# Patient Record
Sex: Female | Born: 1940 | Race: White | Hispanic: No | Smoking: Never smoker
Health system: Southern US, Community
[De-identification: ages and names within clinical notes are randomized; demographics above are authoritative.]

## PROBLEM LIST (undated history)

## (undated) DIAGNOSIS — E079 Disorder of thyroid, unspecified: Secondary | ICD-10-CM

## (undated) DIAGNOSIS — E785 Hyperlipidemia, unspecified: Secondary | ICD-10-CM

## (undated) DIAGNOSIS — I10 Essential (primary) hypertension: Secondary | ICD-10-CM

## (undated) DIAGNOSIS — F329 Major depressive disorder, single episode, unspecified: Secondary | ICD-10-CM

## (undated) DIAGNOSIS — F32A Depression, unspecified: Secondary | ICD-10-CM

## (undated) DIAGNOSIS — E119 Type 2 diabetes mellitus without complications: Secondary | ICD-10-CM

## (undated) DIAGNOSIS — F0280 Dementia in other diseases classified elsewhere without behavioral disturbance: Secondary | ICD-10-CM

## (undated) DIAGNOSIS — G309 Alzheimer's disease, unspecified: Secondary | ICD-10-CM

---

## 2004-05-23 ENCOUNTER — Ambulatory Visit: Payer: Self-pay | Admitting: Internal Medicine

## 2005-08-24 ENCOUNTER — Ambulatory Visit: Payer: Self-pay | Admitting: Internal Medicine

## 2006-12-05 ENCOUNTER — Ambulatory Visit: Payer: Self-pay | Admitting: Internal Medicine

## 2007-12-09 ENCOUNTER — Ambulatory Visit: Payer: Self-pay | Admitting: Internal Medicine

## 2008-12-09 ENCOUNTER — Ambulatory Visit: Payer: Self-pay | Admitting: Internal Medicine

## 2008-12-29 ENCOUNTER — Ambulatory Visit: Payer: Self-pay | Admitting: Internal Medicine

## 2009-11-26 ENCOUNTER — Ambulatory Visit: Payer: Self-pay | Admitting: Family Medicine

## 2011-04-26 ENCOUNTER — Ambulatory Visit: Payer: Self-pay | Admitting: Internal Medicine

## 2012-05-26 ENCOUNTER — Ambulatory Visit: Payer: Self-pay | Admitting: Internal Medicine

## 2013-11-04 ENCOUNTER — Ambulatory Visit: Payer: Self-pay | Admitting: Ophthalmology

## 2013-12-02 ENCOUNTER — Ambulatory Visit: Payer: Self-pay | Admitting: Ophthalmology

## 2016-04-03 ENCOUNTER — Emergency Department: Payer: Medicare Other

## 2016-04-03 ENCOUNTER — Observation Stay
Admission: EM | Admit: 2016-04-03 | Discharge: 2016-04-05 | Disposition: A | Discharge status: Skilled Nursing Facility | Payer: Medicare Other | Attending: Internal Medicine | Admitting: Internal Medicine

## 2016-04-03 DIAGNOSIS — D509 Iron deficiency anemia, unspecified: Secondary | ICD-10-CM | POA: Diagnosis not present

## 2016-04-03 DIAGNOSIS — F329 Major depressive disorder, single episode, unspecified: Secondary | ICD-10-CM | POA: Diagnosis not present

## 2016-04-03 DIAGNOSIS — E039 Hypothyroidism, unspecified: Secondary | ICD-10-CM | POA: Insufficient documentation

## 2016-04-03 DIAGNOSIS — E871 Hypo-osmolality and hyponatremia: Secondary | ICD-10-CM

## 2016-04-03 DIAGNOSIS — I700 Atherosclerosis of aorta: Secondary | ICD-10-CM | POA: Diagnosis not present

## 2016-04-03 DIAGNOSIS — R319 Hematuria, unspecified: Secondary | ICD-10-CM | POA: Diagnosis not present

## 2016-04-03 DIAGNOSIS — G3189 Other specified degenerative diseases of nervous system: Secondary | ICD-10-CM | POA: Diagnosis not present

## 2016-04-03 DIAGNOSIS — E785 Hyperlipidemia, unspecified: Secondary | ICD-10-CM | POA: Insufficient documentation

## 2016-04-03 DIAGNOSIS — Y92002 Bathroom of unspecified non-institutional (private) residence single-family (private) house as the place of occurrence of the external cause: Secondary | ICD-10-CM | POA: Diagnosis not present

## 2016-04-03 DIAGNOSIS — I10 Essential (primary) hypertension: Secondary | ICD-10-CM | POA: Insufficient documentation

## 2016-04-03 DIAGNOSIS — Z794 Long term (current) use of insulin: Secondary | ICD-10-CM | POA: Insufficient documentation

## 2016-04-03 DIAGNOSIS — W010XXA Fall on same level from slipping, tripping and stumbling without subsequent striking against object, initial encounter: Secondary | ICD-10-CM | POA: Insufficient documentation

## 2016-04-03 DIAGNOSIS — E119 Type 2 diabetes mellitus without complications: Secondary | ICD-10-CM | POA: Diagnosis not present

## 2016-04-03 DIAGNOSIS — N3000 Acute cystitis without hematuria: Principal | ICD-10-CM

## 2016-04-03 DIAGNOSIS — G309 Alzheimer's disease, unspecified: Secondary | ICD-10-CM | POA: Insufficient documentation

## 2016-04-03 DIAGNOSIS — F0280 Dementia in other diseases classified elsewhere without behavioral disturbance: Secondary | ICD-10-CM | POA: Diagnosis present

## 2016-04-03 DIAGNOSIS — Z7984 Long term (current) use of oral hypoglycemic drugs: Secondary | ICD-10-CM | POA: Insufficient documentation

## 2016-04-03 DIAGNOSIS — Y998 Other external cause status: Secondary | ICD-10-CM | POA: Diagnosis not present

## 2016-04-03 DIAGNOSIS — I350 Nonrheumatic aortic (valve) stenosis: Secondary | ICD-10-CM | POA: Diagnosis not present

## 2016-04-03 DIAGNOSIS — R531 Weakness: Secondary | ICD-10-CM

## 2016-04-03 DIAGNOSIS — R0989 Other specified symptoms and signs involving the circulatory and respiratory systems: Secondary | ICD-10-CM

## 2016-04-03 DIAGNOSIS — Z66 Do not resuscitate: Secondary | ICD-10-CM | POA: Diagnosis not present

## 2016-04-03 DIAGNOSIS — M50322 Other cervical disc degeneration at C5-C6 level: Secondary | ICD-10-CM | POA: Insufficient documentation

## 2016-04-03 DIAGNOSIS — N390 Urinary tract infection, site not specified: Secondary | ICD-10-CM

## 2016-04-03 DIAGNOSIS — R778 Other specified abnormalities of plasma proteins: Secondary | ICD-10-CM

## 2016-04-03 DIAGNOSIS — R748 Abnormal levels of other serum enzymes: Secondary | ICD-10-CM | POA: Insufficient documentation

## 2016-04-03 DIAGNOSIS — W19XXXA Unspecified fall, initial encounter: Secondary | ICD-10-CM

## 2016-04-03 DIAGNOSIS — M6282 Rhabdomyolysis: Secondary | ICD-10-CM | POA: Insufficient documentation

## 2016-04-03 DIAGNOSIS — D649 Anemia, unspecified: Secondary | ICD-10-CM | POA: Diagnosis not present

## 2016-04-03 DIAGNOSIS — R7989 Other specified abnormal findings of blood chemistry: Secondary | ICD-10-CM | POA: Diagnosis present

## 2016-04-03 DIAGNOSIS — M25552 Pain in left hip: Secondary | ICD-10-CM | POA: Insufficient documentation

## 2016-04-03 DIAGNOSIS — I6523 Occlusion and stenosis of bilateral carotid arteries: Secondary | ICD-10-CM | POA: Insufficient documentation

## 2016-04-03 DIAGNOSIS — R262 Difficulty in walking, not elsewhere classified: Secondary | ICD-10-CM

## 2016-04-03 DIAGNOSIS — Y9389 Activity, other specified: Secondary | ICD-10-CM | POA: Diagnosis not present

## 2016-04-03 DIAGNOSIS — Z7982 Long term (current) use of aspirin: Secondary | ICD-10-CM | POA: Insufficient documentation

## 2016-04-03 HISTORY — DX: Essential (primary) hypertension: I10

## 2016-04-03 HISTORY — DX: Alzheimer's disease, unspecified: G30.9

## 2016-04-03 HISTORY — DX: Depression, unspecified: F32.A

## 2016-04-03 HISTORY — DX: Dementia in other diseases classified elsewhere, unspecified severity, without behavioral disturbance, psychotic disturbance, mood disturbance, and anxiety: F02.80

## 2016-04-03 HISTORY — DX: Disorder of thyroid, unspecified: E07.9

## 2016-04-03 HISTORY — DX: Hyperlipidemia, unspecified: E78.5

## 2016-04-03 HISTORY — DX: Type 2 diabetes mellitus without complications: E11.9

## 2016-04-03 HISTORY — DX: Major depressive disorder, single episode, unspecified: F32.9

## 2016-04-03 LAB — CBC WITH DIFFERENTIAL/PLATELET
BASOS ABS: 0 10*3/uL (ref 0–0.1)
BASOS PCT: 0 %
EOS ABS: 0 10*3/uL (ref 0–0.7)
EOS PCT: 0 %
HCT: 31.6 % — ABNORMAL LOW (ref 35.0–47.0)
Hemoglobin: 10.5 g/dL — ABNORMAL LOW (ref 12.0–16.0)
LYMPHS PCT: 8 %
Lymphs Abs: 0.9 10*3/uL — ABNORMAL LOW (ref 1.0–3.6)
MCH: 28.6 pg (ref 26.0–34.0)
MCHC: 33.1 g/dL (ref 32.0–36.0)
MCV: 86.2 fL (ref 80.0–100.0)
MONO ABS: 1.3 10*3/uL — AB (ref 0.2–0.9)
Monocytes Relative: 10 %
Neutro Abs: 10.3 10*3/uL — ABNORMAL HIGH (ref 1.4–6.5)
Neutrophils Relative %: 82 %
PLATELETS: 320 10*3/uL (ref 150–440)
RBC: 3.66 MIL/uL — AB (ref 3.80–5.20)
RDW: 15.6 % — AB (ref 11.5–14.5)
WBC: 12.6 10*3/uL — AB (ref 3.6–11.0)

## 2016-04-03 LAB — URINALYSIS COMPLETE WITH MICROSCOPIC (ARMC ONLY)
BILIRUBIN URINE: NEGATIVE
KETONES UR: NEGATIVE mg/dL
NITRITE: POSITIVE — AB
PH: 5 (ref 5.0–8.0)
Protein, ur: 100 mg/dL — AB
SPECIFIC GRAVITY, URINE: 1.02 (ref 1.005–1.030)
SQUAMOUS EPITHELIAL / LPF: NONE SEEN

## 2016-04-03 LAB — BASIC METABOLIC PANEL
Anion gap: 10 (ref 5–15)
BUN: 12 mg/dL (ref 6–20)
CHLORIDE: 97 mmol/L — AB (ref 101–111)
CO2: 25 mmol/L (ref 22–32)
CREATININE: 0.75 mg/dL (ref 0.44–1.00)
Calcium: 9.1 mg/dL (ref 8.9–10.3)
GFR calc non Af Amer: >60 mL/min (ref >60)
GLUCOSE: 216 mg/dL — AB (ref 65–99)
Potassium: 3.8 mmol/L (ref 3.5–5.1)
Sodium: 132 mmol/L — ABNORMAL LOW (ref 135–145)

## 2016-04-03 LAB — TROPONIN I: Troponin I: 0.38 ng/mL (ref <0.03)

## 2016-04-03 MED ORDER — LORAZEPAM 2 MG/ML IJ SOLN
INTRAMUSCULAR | Status: AC
  Administered 2016-04-04: 1 mg via INTRAVENOUS
  Filled 2016-04-03: qty 1

## 2016-04-03 MED ORDER — DEXTROSE 5 % IV SOLN
1.0000 g | Freq: Once | INTRAVENOUS | Status: AC
  Administered 2016-04-03: 1 g via INTRAVENOUS

## 2016-04-03 MED ORDER — LORAZEPAM 2 MG/ML IJ SOLN
1.0000 mg | Freq: Once | INTRAMUSCULAR | Status: AC
  Administered 2016-04-04: 1 mg via INTRAVENOUS

## 2016-04-03 MED ORDER — CEFTRIAXONE SODIUM-DEXTROSE 1-3.74 GM-% IV SOLR
INTRAVENOUS | Status: AC
  Filled 2016-04-03: qty 50

## 2016-04-03 NOTE — ED Notes (Signed)
Dr Don PerkingVeronese notified of elevated troponin

## 2016-04-03 NOTE — ED Notes (Signed)
2 unsuccessful PIV attempts by this RN (Rt AC and Rt forearm). Rosey Batheresa, primary RN, made aware.

## 2016-04-03 NOTE — ED Notes (Addendum)
Pt arrived via ems for c/o unwitnessed fall - ems reports that pt was cool and pale on arrival and pt was found in floor in bathroom by staff - pt denies any pain or injury - [facility] pt does have history of alzheimer's and is a poor historian

## 2016-04-03 NOTE — ED Notes (Signed)
In and out cath to obtain urine sample - procedure tolerated well

## 2016-04-03 NOTE — ED Triage Notes (Signed)
Pt arrived via ems for c/o unwitnessed fall - ems reports that pt was cool and pale on arrival and found in floor in bathroom - pt denies any pain or injury

## 2016-04-03 NOTE — ED Provider Notes (Signed)
[facility] Emergency Department Provider Note  ____________________________________________  Time seen: Approximately 7:35 PM  I have reviewed the triage vital signs and the nursing notes.   HISTORY  Chief Complaint Fall  Level 5 caveat:  Portions of the history and physical were unable to be obtained due to dementia   HPI Glenda Vaughn is a 75 y.o. female with a history of Alzheimer's dementia, diabetes, depression, hypertension, hyperlipidemia who presents for evaluation of unwitnessed fall. Patient was found in her skilled nursing facility on the ground next to the toilet. According to EMS patient was pale and cold and they found her. Patient does not remember falling. Patient has no complaints at this time and denies headache, neck pain, back pain, chest pain, shortness of breath, abdominal pain, extremity pain. Patient is not on any blood thinners. According to patient's daughter, patient seemed a little bit more unsteady with her gait this afternoon when daughter dropped her off at the SNF.  Past Medical History:  Diagnosis Date  . Alzheimer's dementia   . Depression   . Diabetes mellitus without complication (HCC)   . Hyperlipidemia   . Hypertension   . Thyroid disease     There are no active problems to display for this patient.   History reviewed. No pertinent surgical history.  Prior to Admission medications   Medication Sig Start Date End Date Taking? Authorizing Provider  aspirin 81 MG chewable tablet Chew 81 mg by mouth daily.   Yes Historical Provider, MD  atorvastatin (LIPITOR) 20 MG tablet Take 20 mg by mouth daily at 6 PM.   Yes Historical Provider, MD  Calcium Carbonate-Vitamin D3 (CALCIUM 600-D) 600-400 MG-UNIT TABS Take 1 tablet by mouth daily.   Yes Historical Provider, MD  Diphenhydramine-APAP, sleep, (TYLENOL PM EXTRA STRENGTH PO) Take 1 tablet by mouth every evening.   Yes Historical Provider, MD  donepezil (ARICEPT) 10 MG  tablet Take 10 mg by mouth daily.   Yes Historical Provider, MD  FLUoxetine (PROZAC) 20 MG capsule Take 20 mg by mouth daily.   Yes Historical Provider, MD  glimepiride (AMARYL) 2 MG tablet Take 2 mg by mouth daily with breakfast.   Yes Historical Provider, MD  levothyroxine (SYNTHROID, LEVOTHROID) 112 MCG tablet Take 112 mcg by mouth daily before breakfast.   Yes Historical Provider, MD  loperamide (IMODIUM) 2 MG capsule Take 2 mg by mouth as needed for diarrhea or loose stools.   Yes Historical Provider, MD  memantine (NAMENDA) 10 MG tablet Take 10 mg by mouth 2 (two) times daily.   Yes Historical Provider, MD  metFORMIN (GLUCOPHAGE-XR) 500 MG 24 hr tablet Take 500 mg by mouth 2 (two) times daily.   Yes Historical Provider, MD  Multiple Vitamin (MULTIVITAMIN) tablet Take 1 tablet by mouth daily.   Yes Historical Provider, MD  naproxen sodium (ANAPROX) 220 MG tablet Take 220 mg by mouth 2 (two) times daily with a meal.   Yes Historical Provider, MD  Probiotic Product (ALIGN) 4 MG CAPS Take 4 mg by mouth daily.   Yes Historical Provider, MD  vitamin C (ASCORBIC ACID) 500 MG tablet Take 500 mg by mouth daily.   Yes Historical Provider, MD    Allergies Review of patient's allergies indicates no known allergies.  No family history on file.  Social History Social History  Substance Use Topics  . Smoking status: Never Smoker  . Smokeless tobacco: Never Used  . Alcohol use No    Review of  Systems  Constitutional: Negative for fever. Eyes: Negative for visual changes. ENT: Negative for facial injury or neck injury Cardiovascular: Negative for chest injury. Respiratory: Negative for shortness of breath. Negative for chest wall injury. Gastrointestinal: Negative for abdominal pain or injury. Genitourinary: Negative for dysuria. Musculoskeletal: Negative for back injury, negative for arm or leg pain. Skin: Negative for laceration/abrasions. Neurological: Negative for head  injury.  ____________________________________________   PHYSICAL EXAM:  VITAL SIGNS: ED Triage Vitals  Enc Vitals Group     BP 04/03/16 1909 125/67     Pulse Rate 04/03/16 1909 86     Resp 04/03/16 1909 16     Temp 04/03/16 1909 98.2 F (36.8 C)     Temp src --      SpO2 04/03/16 1909 100 %     Weight 04/03/16 1905 122 lb 12.7 oz (55.7 kg)     Height 04/03/16 1905 5\' 8"  (1.727 m)     Head Circumference --      Peak Flow --      Pain Score 04/03/16 1906 0     Pain Loc --      Pain Edu? --      Excl. in GC? --     Constitutional: Alert and oriented. No acute distress. Does not appear intoxicated. HEENT Head: Normocephalic and atraumatic. Face: No facial bony tenderness. Stable midface Ears: No hemotympanum bilaterally. No Battle sign Eyes: No eye injury. PERRL. No raccoon eyes Nose: Nontender. No epistaxis. No rhinorrhea Mouth/Throat: Mucous membranes are moist. No oropharyngeal blood. No dental injury. Airway patent without stridor. Normal voice. Neck: no C-collar in place. No midline c-spine tenderness.  Cardiovascular: Normal rate, regular rhythm. Normal and symmetric distal pulses are present in all extremities. Pulmonary/Chest: Chest wall is stable and nontender to palpation/compression. Normal respiratory effort. Breath sounds are normal. No crepitus.  Abdominal: Soft, nontender, non distended. Musculoskeletal: Nontender with normal full range of motion in all extremities. No deformities. No thoracic or lumbar midline spinal tenderness. Pelvis is stable. Mild ttp over the L hip with external rotation. Skin: Skin is warm, dry and intact. No abrasions or contutions. Psychiatric: Speech and behavior are appropriate. Neurological: Normal speech and language. Moves all extremities to command. No gross focal neurologic deficits are appreciated.  Glascow Coma Score: 4 - Opens eyes on own 6 - Follows simple motor commands 5 - Alert and oriented GCS:  15   ____________________________________________   LABS (all labs ordered are listed, but only abnormal results are displayed)  Labs Reviewed  CBC WITH DIFFERENTIAL/PLATELET - Abnormal; Notable for the following:       Result Value   WBC 12.6 (*)    RBC 3.66 (*)    Hemoglobin 10.5 (*)    HCT 31.6 (*)    RDW 15.6 (*)    Neutro Abs 10.3 (*)    Lymphs Abs 0.9 (*)    Monocytes Absolute 1.3 (*)    All other components within normal limits  URINALYSIS COMPLETEWITH MICROSCOPIC ([facility] ONLY) - Abnormal; Notable for the following:    Color, Urine YELLOW (*)    APPearance CLOUDY (*)    Glucose, UA >500 (*)    Hgb urine dipstick 3+ (*)    Protein, ur 100 (*)    Nitrite POSITIVE (*)    Leukocytes, UA 2+ (*)    Bacteria, UA MANY (*)    All other components within normal limits  BASIC METABOLIC PANEL - Abnormal; Notable for the following:    Sodium  132 (*)    Chloride 97 (*)    Glucose, Bld 216 (*)    All other components within normal limits  TROPONIN I - Abnormal; Notable for the following:    Troponin I 0.38 (*)    All other components within normal limits  URINE CULTURE   ____________________________________________  EKG  ED ECG REPORT I, Nita Sickle, the attending physician, personally viewed and interpreted this ECG.  Normal sinus rhythm, rate of 89, normal intervals, normal axis, no ST elevations or depressions. ____________________________________________  RADIOLOGY  CT head and c-spine:  Atrophy with small vessel chronic ischemic changes of deep cerebral white matter. No acute intracranial abnormalities. Scattered degenerative disc and facet disease changes of the cervical spine as above. No acute cervical spine abnormalities.  XR hip: Normal left hip. ____________________________________________   PROCEDURES  Procedure(s) performed: None Procedures Critical Care performed: yes  CRITICAL CARE Performed by: Nita Sickle  ?  Total critical  care time:  Critical care time was exclusive of separately billable procedures and treating other patients.  Critical care was necessary to treat or prevent imminent or life-threatening deterioration.  Critical care was time spent personally by me on the following activities: development of treatment plan with patient and/or surrogate as well as nursing, discussions with consultants, evaluation of patient's response to treatment, examination of patient, obtaining history from patient or surrogate, ordering and performing treatments and interventions, ordering and review of laboratory studies, ordering and review of radiographic studies, pulse oximetry and re-evaluation of patient's condition.  ____________________________________________   INITIAL IMPRESSION / ASSESSMENT AND PLAN / ED COURSE   75 y.o. female with a history of Alzheimer's dementia, diabetes, depression, hypertension, hyperlipidemia who presents for evaluation of unwitnessed fall. Patient has no complaints however does have mild tenderness with internal rotation of the left hip with no deformities. We'll get a head CT, CT cervical spine, x-ray of the left hip, EKG and basic labs and UA as fall was unwitnessed.  Clinical Course  Comment By Time  US showing UTI with mildly leukocytosis. Patient given ceftriaxone. Troponin elevated 0.38. Non ischemic EKG. Patient denies CP. Will admit to [facility], MD 10/17 2128    Pertinent labs & imaging results that were available during my care of the patient were reviewed by me and considered in my medical decision making (see chart for details).    ____________________________________________   FINAL CLINICAL IMPRESSION(S) / ED DIAGNOSES  Final diagnoses:  Fall  Elevated troponin  Urinary tract infection with hematuria, site unspecified      NEW MEDICATIONS STARTED DURING THIS VISIT:  New Prescriptions   No medications on file     Note:  This  document was prepared using Dragon voice recognition software and may include unintentional dictation errors.    Nita Sickle, MD 04/03/16 [phone]

## 2016-04-04 ENCOUNTER — Inpatient Hospital Stay (HOSPITAL_BASED_OUTPATIENT_CLINIC_OR_DEPARTMENT_OTHER)
Admit: 2016-04-04 | Discharge: 2016-04-04 | Disposition: A | Discharge status: Home / Self Care | Payer: Medicare Other | Attending: Family Medicine | Admitting: Family Medicine

## 2016-04-04 ENCOUNTER — Inpatient Hospital Stay: Payer: Medicare Other

## 2016-04-04 DIAGNOSIS — I350 Nonrheumatic aortic (valve) stenosis: Secondary | ICD-10-CM | POA: Diagnosis not present

## 2016-04-04 DIAGNOSIS — R011 Cardiac murmur, unspecified: Secondary | ICD-10-CM | POA: Diagnosis not present

## 2016-04-04 DIAGNOSIS — G309 Alzheimer's disease, unspecified: Secondary | ICD-10-CM

## 2016-04-04 DIAGNOSIS — R531 Weakness: Secondary | ICD-10-CM

## 2016-04-04 DIAGNOSIS — D509 Iron deficiency anemia, unspecified: Secondary | ICD-10-CM

## 2016-04-04 DIAGNOSIS — R748 Abnormal levels of other serum enzymes: Secondary | ICD-10-CM | POA: Diagnosis not present

## 2016-04-04 DIAGNOSIS — W19XXXA Unspecified fall, initial encounter: Secondary | ICD-10-CM | POA: Diagnosis present

## 2016-04-04 DIAGNOSIS — R7989 Other specified abnormal findings of blood chemistry: Secondary | ICD-10-CM

## 2016-04-04 DIAGNOSIS — N3000 Acute cystitis without hematuria: Secondary | ICD-10-CM

## 2016-04-04 DIAGNOSIS — N390 Urinary tract infection, site not specified: Secondary | ICD-10-CM

## 2016-04-04 DIAGNOSIS — R778 Other specified abnormalities of plasma proteins: Secondary | ICD-10-CM | POA: Diagnosis present

## 2016-04-04 DIAGNOSIS — F0280 Dementia in other diseases classified elsewhere without behavioral disturbance: Secondary | ICD-10-CM

## 2016-04-04 DIAGNOSIS — E871 Hypo-osmolality and hyponatremia: Secondary | ICD-10-CM

## 2016-04-04 DIAGNOSIS — R319 Hematuria, unspecified: Secondary | ICD-10-CM

## 2016-04-04 LAB — CK TOTAL AND CKMB (NOT AT ARMC)
CK, MB: 85.8 ng/mL — AB (ref 0.5–5.0)
RELATIVE INDEX: 0.5 (ref 0.0–2.5)
Total CK: 18411 U/L — ABNORMAL HIGH (ref 38–234)

## 2016-04-04 LAB — LIPID PANEL
Cholesterol: 154 mg/dL (ref 0–200)
HDL: 83 mg/dL (ref >40)
LDL CALC: 59 mg/dL (ref 0–99)
Total CHOL/HDL Ratio: 1.9 RATIO
Triglycerides: 61 mg/dL (ref <150)
VLDL: 12 mg/dL (ref 0–40)

## 2016-04-04 LAB — CBC
HEMATOCRIT: 32.4 % — AB (ref 35.0–47.0)
Hemoglobin: 11 g/dL — ABNORMAL LOW (ref 12.0–16.0)
MCH: 29.2 pg (ref 26.0–34.0)
MCHC: 34.1 g/dL (ref 32.0–36.0)
MCV: 85.5 fL (ref 80.0–100.0)
PLATELETS: 328 10*3/uL (ref 150–440)
RBC: 3.79 MIL/uL — ABNORMAL LOW (ref 3.80–5.20)
RDW: 15.7 % — AB (ref 11.5–14.5)
WBC: 10.5 10*3/uL (ref 3.6–11.0)

## 2016-04-04 LAB — PROTIME-INR
INR: 1.02
Prothrombin Time: 13.4 seconds (ref 11.4–15.2)

## 2016-04-04 LAB — GLUCOSE, CAPILLARY
GLUCOSE-CAPILLARY: 229 mg/dL — AB (ref 65–99)
GLUCOSE-CAPILLARY: 166 mg/dL — AB (ref 65–99)
GLUCOSE-CAPILLARY: 144 mg/dL — AB (ref 65–99)
GLUCOSE-CAPILLARY: 166 mg/dL — AB (ref 65–99)

## 2016-04-04 LAB — BASIC METABOLIC PANEL
Anion gap: 8 (ref 5–15)
BUN: 9 mg/dL (ref 6–20)
CHLORIDE: 99 mmol/L — AB (ref 101–111)
CO2: 27 mmol/L (ref 22–32)
CREATININE: 0.59 mg/dL (ref 0.44–1.00)
Calcium: 8.7 mg/dL — ABNORMAL LOW (ref 8.9–10.3)
GFR calc Af Amer: >60 mL/min (ref >60)
GFR calc non Af Amer: >60 mL/min (ref >60)
GLUCOSE: 160 mg/dL — AB (ref 65–99)
POTASSIUM: 3.5 mmol/L (ref 3.5–5.1)
SODIUM: 134 mmol/L — AB (ref 135–145)

## 2016-04-04 LAB — APTT: APTT: 29 s (ref 24–36)

## 2016-04-04 LAB — ECHOCARDIOGRAM COMPLETE
Height: 68 in
WEIGHTICAEL: 1964.74 [oz_av]

## 2016-04-04 LAB — TROPONIN I
TROPONIN I: 0.99 ng/mL — AB (ref <0.03)
TROPONIN I: 0.92 ng/mL — AB (ref <0.03)
TROPONIN I: 0.72 ng/mL — AB (ref <0.03)

## 2016-04-04 LAB — MRSA PCR SCREENING: MRSA BY PCR: NEGATIVE

## 2016-04-04 LAB — TSH: TSH: 5.239 u[IU]/mL — AB (ref 0.350–4.500)

## 2016-04-04 LAB — FOLATE: Folate: 21.6 ng/mL (ref >5.9)

## 2016-04-04 LAB — VITAMIN B12: Vitamin B-12: 216 pg/mL (ref 180–914)

## 2016-04-04 LAB — IRON AND TIBC
IRON: 11 ug/dL — AB (ref 28–170)
SATURATION RATIOS: 3 % — AB (ref 10.4–31.8)
TIBC: 355 ug/dL (ref 250–450)
UIBC: 344 ug/dL

## 2016-04-04 LAB — MAGNESIUM: MAGNESIUM: 1.8 mg/dL (ref 1.7–2.4)

## 2016-04-04 LAB — T4, FREE: Free T4: 0.96 ng/dL (ref 0.61–1.12)

## 2016-04-04 LAB — FERRITIN: FERRITIN: 24 ng/mL (ref 11–307)

## 2016-04-04 MED ORDER — LEVOTHYROXINE SODIUM 112 MCG PO TABS
112.0000 ug | ORAL_TABLET | Freq: Every day | ORAL | Status: DC
  Administered 2016-04-04 – 2016-04-05 (×2): 112 ug via ORAL
  Filled 2016-04-04 (×2): qty 1

## 2016-04-04 MED ORDER — VITAMIN C 500 MG PO TABS
500.0000 mg | ORAL_TABLET | Freq: Every day | ORAL | Status: DC
  Administered 2016-04-04 – 2016-04-05 (×2): 500 mg via ORAL
  Filled 2016-04-04 (×2): qty 1

## 2016-04-04 MED ORDER — SODIUM CHLORIDE 0.9 % IV SOLN
INTRAVENOUS | Status: DC
  Administered 2016-04-04: 75 mL/h via INTRAVENOUS

## 2016-04-04 MED ORDER — INSULIN ASPART 100 UNIT/ML ~~LOC~~ SOLN
0.0000 [IU] | Freq: Every day | SUBCUTANEOUS | Status: DC
  Administered 2016-04-04: 2 [IU] via SUBCUTANEOUS
  Filled 2016-04-04: qty 2

## 2016-04-04 MED ORDER — ENOXAPARIN SODIUM 40 MG/0.4ML ~~LOC~~ SOLN
40.0000 mg | SUBCUTANEOUS | Status: DC
  Administered 2016-04-04: 40 mg via SUBCUTANEOUS
  Filled 2016-04-04: qty 0.4

## 2016-04-04 MED ORDER — HEPARIN BOLUS VIA INFUSION
3400.0000 [IU] | Freq: Once | INTRAVENOUS | Status: AC
Start: 2016-04-04 — End: 2016-04-04
  Administered 2016-04-04: 3400 [IU] via INTRAVENOUS
  Filled 2016-04-04: qty 3400

## 2016-04-04 MED ORDER — ADULT MULTIVITAMIN W/MINERALS CH
1.0000 | ORAL_TABLET | Freq: Every day | ORAL | Status: DC
Start: 2016-04-04 — End: 2016-04-05
  Administered 2016-04-04 – 2016-04-05 (×2): 1 via ORAL
  Filled 2016-04-04 (×2): qty 1

## 2016-04-04 MED ORDER — ASPIRIN 81 MG PO CHEW
81.0000 mg | CHEWABLE_TABLET | Freq: Every day | ORAL | Status: DC
  Administered 2016-04-04 – 2016-04-05 (×2): 81 mg via ORAL
  Filled 2016-04-04 (×2): qty 1

## 2016-04-04 MED ORDER — METOPROLOL TARTRATE 25 MG PO TABS
12.5000 mg | ORAL_TABLET | Freq: Two times a day (BID) | ORAL | Status: DC
  Administered 2016-04-04 – 2016-04-05 (×3): 12.5 mg via ORAL
  Filled 2016-04-04 (×3): qty 1

## 2016-04-04 MED ORDER — INSULIN ASPART 100 UNIT/ML ~~LOC~~ SOLN
0.0000 [IU] | Freq: Three times a day (TID) | SUBCUTANEOUS | Status: DC
  Administered 2016-04-04: 2 [IU] via SUBCUTANEOUS
  Administered 2016-04-04: 1 [IU] via SUBCUTANEOUS
  Administered 2016-04-05: 2 [IU] via SUBCUTANEOUS
  Administered 2016-04-05: 5 [IU] via SUBCUTANEOUS
  Filled 2016-04-04: qty 2
  Filled 2016-04-04: qty 5
  Filled 2016-04-04: qty 2
  Filled 2016-04-04: qty 1

## 2016-04-04 MED ORDER — DEXTROSE 5 % IV SOLN: 1.0000 g | INTRAVENOUS | Status: DC

## 2016-04-04 MED ORDER — CEPHALEXIN 500 MG PO CAPS: 500.0000 mg | ORAL_CAPSULE | Freq: Three times a day (TID) | ORAL | 0 refills | Status: DC

## 2016-04-04 MED ORDER — DIPHENHYDRAMINE HCL 25 MG PO CAPS
25.0000 mg | ORAL_CAPSULE | Freq: Every day | ORAL | Status: DC
  Administered 2016-04-04 (×2): 25 mg via ORAL
  Filled 2016-04-04 (×2): qty 1

## 2016-04-04 MED ORDER — ALIGN 4 MG PO CAPS: 4.0000 mg | ORAL_CAPSULE | Freq: Every day | ORAL | Status: DC

## 2016-04-04 MED ORDER — CALCIUM CARBONATE-VITAMIN D3 600-400 MG-UNIT PO TABS: 1.0000 | ORAL_TABLET | Freq: Every day | ORAL | Status: DC

## 2016-04-04 MED ORDER — CALCIUM CARBONATE-VITAMIN D 500-200 MG-UNIT PO TABS
1.0000 | ORAL_TABLET | Freq: Every day | ORAL | Status: DC
  Administered 2016-04-04 – 2016-04-05 (×2): 1 via ORAL
  Filled 2016-04-04 (×2): qty 1

## 2016-04-04 MED ORDER — MEMANTINE HCL 5 MG PO TABS
10.0000 mg | ORAL_TABLET | Freq: Two times a day (BID) | ORAL | Status: DC
  Administered 2016-04-04 – 2016-04-05 (×3): 10 mg via ORAL
  Filled 2016-04-04 (×3): qty 2

## 2016-04-04 MED ORDER — LOPERAMIDE HCL 2 MG PO CAPS: 2.0000 mg | ORAL_CAPSULE | ORAL | Status: DC | PRN

## 2016-04-04 MED ORDER — ATORVASTATIN CALCIUM 20 MG PO TABS
20.0000 mg | ORAL_TABLET | Freq: Every day | ORAL | Status: DC
  Administered 2016-04-04: 20 mg via ORAL
  Filled 2016-04-04: qty 1

## 2016-04-04 MED ORDER — ACETAMINOPHEN 500 MG PO TABS
500.0000 mg | ORAL_TABLET | Freq: Every day | ORAL | Status: DC
  Administered 2016-04-04: 500 mg via ORAL
  Filled 2016-04-04: qty 1

## 2016-04-04 MED ORDER — ONDANSETRON HCL 4 MG/2ML IJ SOLN: 4.0000 mg | Freq: Four times a day (QID) | INTRAMUSCULAR | Status: DC | PRN

## 2016-04-04 MED ORDER — DIPHENHYDRAMINE-APAP (SLEEP) 25-500 MG PO TABS: 1.0000 | ORAL_TABLET | Freq: Every evening | ORAL | Status: DC

## 2016-04-04 MED ORDER — ACETAMINOPHEN 325 MG PO TABS: 650.0000 mg | ORAL_TABLET | ORAL | Status: DC | PRN

## 2016-04-04 MED ORDER — POTASSIUM CHLORIDE IN NACL 20-0.9 MEQ/L-% IV SOLN
INTRAVENOUS | Status: DC
  Administered 2016-04-04: 125 mL/h via INTRAVENOUS
  Administered 2016-04-04: 11:00:00 via INTRAVENOUS
  Administered 2016-04-05: 125 mL/h via INTRAVENOUS
  Filled 2016-04-04 (×5): qty 1000

## 2016-04-04 MED ORDER — CEFTRIAXONE SODIUM-DEXTROSE 1-3.74 GM-% IV SOLR
1.0000 g | INTRAVENOUS | Status: DC
  Administered 2016-04-04: 1 g via INTRAVENOUS
  Filled 2016-04-04 (×2): qty 50

## 2016-04-04 MED ORDER — DEXTROSE 5 % IV SOLN: 2.0000 g | INTRAVENOUS | Status: DC

## 2016-04-04 MED ORDER — RISAQUAD PO CAPS: 1.0000 | ORAL_CAPSULE | Freq: Every day | ORAL | Status: DC

## 2016-04-04 MED ORDER — METOPROLOL TARTRATE 5 MG/5ML IV SOLN: 5.0000 mg | Freq: Once | INTRAVENOUS | Status: DC

## 2016-04-04 MED ORDER — DONEPEZIL HCL 5 MG PO TABS
10.0000 mg | ORAL_TABLET | Freq: Every day | ORAL | Status: DC
  Administered 2016-04-04 – 2016-04-05 (×2): 10 mg via ORAL
  Filled 2016-04-04 (×2): qty 2

## 2016-04-04 MED ORDER — ASPIRIN 81 MG PO CHEW
324.0000 mg | CHEWABLE_TABLET | ORAL | Status: AC
  Administered 2016-04-04: 324 mg via ORAL
  Filled 2016-04-04: qty 4

## 2016-04-04 MED ORDER — NITROGLYCERIN 2 % TD OINT
0.5000 [in_us] | TOPICAL_OINTMENT | Freq: Three times a day (TID) | TRANSDERMAL | Status: DC
  Administered 2016-04-04 – 2016-04-05 (×3): 0.5 [in_us] via TOPICAL
  Filled 2016-04-04 (×4): qty 1

## 2016-04-04 MED ORDER — ASPIRIN 300 MG RE SUPP
300.0000 mg | RECTAL | Status: AC
  Filled 2016-04-04: qty 1

## 2016-04-04 MED ORDER — ALPRAZOLAM 0.25 MG PO TABS: 0.2500 mg | ORAL_TABLET | Freq: Two times a day (BID) | ORAL | Status: DC | PRN

## 2016-04-04 MED ORDER — FLUOXETINE HCL 20 MG PO CAPS
20.0000 mg | ORAL_CAPSULE | Freq: Every day | ORAL | Status: DC
  Administered 2016-04-04 – 2016-04-05 (×2): 20 mg via ORAL
  Filled 2016-04-04 (×2): qty 1

## 2016-04-04 MED ORDER — NITROGLYCERIN 0.4 MG SL SUBL: 0.4000 mg | SUBLINGUAL_TABLET | SUBLINGUAL | Status: DC | PRN

## 2016-04-04 MED ORDER — RISAQUAD PO CAPS
1.0000 | ORAL_CAPSULE | Freq: Two times a day (BID) | ORAL | Status: DC
  Administered 2016-04-04 – 2016-04-05 (×3): 1 via ORAL
  Filled 2016-04-04 (×3): qty 1

## 2016-04-04 MED ORDER — HEPARIN (PORCINE) IN NACL 100-0.45 UNIT/ML-% IJ SOLN
650.0000 [IU]/h | INTRAMUSCULAR | Status: DC
  Administered 2016-04-04: 650 [IU]/h via INTRAVENOUS
  Filled 2016-04-04: qty 250

## 2016-04-04 NOTE — Care Management CC44 (Signed)
Condition Code 44 Documentation Completed  Patient Details  Name: Glenda Vaughn MRN: [id number] Date of Birth: [birth year]   Condition Code 44 given:  Yes Patient signature on Condition Code 44 notice:  Yes Documentation of 2 MD's agreement:  Yes Code 44 added to claim:  Yes    Marily MemosLisa M Kelsey Edman, RN 04/04/2016, 12:13 PM

## 2016-04-04 NOTE — H&P (Addendum)
[facility] - [place] @ [facility] Admission History and Physical Glenda Vaughn, D.O.  ---------------------------------------------------------------------------------------------------------------------   PATIENT NAME: Glenda Vaughn MR#: [id number] DATE OF BIRTH: 1941/04/20 DATE OF ADMISSION: 04/03/2016 PRIMARY CARE PHYSICIAN: Jaclyn Shaggy, MD  REQUESTING/REFERRING PHYSICIAN: ED Dr. Don Perking  CHIEF COMPLAINT: Chief Complaint  Patient presents with  . Fall    HISTORY OF PRESENT ILLNESS:Please note that the history is limited by patient's severe dementia-majority of the history was obtained from the family, ED physician and chart. Glenda Vaughn is a 75 y.o. female with a known history of Alzheimer's dementia, diabetes, depression, hypertension, hyperlipidemia presents to the emergency department for evaluation of a fall. According to the patient's daughter she was found except the toilet sitting on the ground and apparently sustained a fall. She was awake and conscious when she was discovered and has had no complaints since then. Patient cannot tell me the circumstances surrounding the fall and in fact does not remember falling. Patient's daughter reportedly takes her to daycare and says that her agitation and cognition have been slightly worse today and her gait was a little bit less steady than usual.  Otherwise there has been no change in status. Patient has been taking medication as prescribed and there has been no recent change in medication or diet.  There has been no recent illness, travel or sick contacts.    Patient denies fevers/chills, weakness, dizziness, chest pain, shortness of breath, N/V/C/D, abdominal pain, dysuria/frequency, changes in mental status.   EMS/ED COURSE:   Patient received Rocephin.  PAST MEDICAL HISTORY: Past Medical History:  Diagnosis Date  . Alzheimer's dementia   . Depression   . Diabetes mellitus without complication (HCC)   .  Hyperlipidemia   . Hypertension   . Thyroid disease       PAST SURGICAL HISTORY: History reviewed. No pertinent surgical history.    SOCIAL HISTORY: Social History  Substance Use Topics  . Smoking status: Never Smoker  . Smokeless tobacco: Never Used  . Alcohol use No      FAMILY HISTORY: History reviewed. No pertinent family history.   MEDICATIONS AT HOME: Prior to Admission medications   Medication Sig Start Date End Date Taking? Authorizing Provider  aspirin 81 MG chewable tablet Chew 81 mg by mouth daily.   Yes Historical Provider, MD  atorvastatin (LIPITOR) 20 MG tablet Take 20 mg by mouth daily at 6 PM.   Yes Historical Provider, MD  Calcium Carbonate-Vitamin D3 (CALCIUM 600-D) 600-400 MG-UNIT TABS Take 1 tablet by mouth daily.   Yes Historical Provider, MD  Diphenhydramine-APAP, sleep, (TYLENOL PM EXTRA STRENGTH PO) Take 1 tablet by mouth every evening.   Yes Historical Provider, MD  donepezil (ARICEPT) 10 MG tablet Take 10 mg by mouth daily.   Yes Historical Provider, MD  FLUoxetine (PROZAC) 20 MG capsule Take 20 mg by mouth daily.   Yes Historical Provider, MD  glimepiride (AMARYL) 2 MG tablet Take 2 mg by mouth daily with breakfast.   Yes Historical Provider, MD  levothyroxine (SYNTHROID, LEVOTHROID) 112 MCG tablet Take 112 mcg by mouth daily before breakfast.   Yes Historical Provider, MD  loperamide (IMODIUM) 2 MG capsule Take 2 mg by mouth as needed for diarrhea or loose stools.   Yes Historical Provider, MD  memantine (NAMENDA) 10 MG tablet Take 10 mg by mouth 2 (two) times daily.   Yes Historical Provider, MD  metFORMIN (GLUCOPHAGE-XR) 500 MG 24 hr tablet Take 500 mg by mouth 2 (two) times daily.  Yes Historical Provider, MD  Multiple Vitamin (MULTIVITAMIN) tablet Take 1 tablet by mouth daily.   Yes Historical Provider, MD  naproxen sodium (ANAPROX) 220 MG tablet Take 220 mg by mouth 2 (two) times daily with a meal.   Yes Historical Provider, MD  Probiotic  Product (ALIGN) 4 MG CAPS Take 4 mg by mouth daily.   Yes Historical Provider, MD  vitamin C (ASCORBIC ACID) 500 MG tablet Take 500 mg by mouth daily.   Yes Historical Provider, MD      DRUG ALLERGIES: No Known Allergies   REVIEW OF SYSTEMS:Patient denies all complaints however history is unreliable secondary to dementia  PHYSICAL EXAMINATION: VITAL SIGNS: Blood pressure (!) 147/65, pulse 93, temperature 98.7 F (37.1 C), temperature source Oral, resp. rate 18, height 5\' 8"  (1.727 m), weight 55.7 kg (122 lb 12.7 oz), SpO2 100 %.  GENERAL: 75 y.o.-year-old white female patient, well-developed, well-nourished lying in the bed in no acute distress.  Pleasantly confused, hard of hearing HEENT: Head atraumatic, normocephalic. Pupils equal, round, reactive to light and accommodation. No scleral icterus. Extraocular muscles intact. Oropharynx is clear. Mucus membranes moist. NECK: Supple, full range of motion. No JVD, positive bruit bilateral carotids. No cervical lymphadenopathy. CHEST: Normal breath sounds bilaterally. No wheezing, rales, rhonchi or crackles. No use of accessory muscles of respiration.  No reproducible chest wall tenderness.  CARDIOVASCULAR: S1, S2 normal. Harsh systolic ejection murmur at left sternal border which radiates to carotids.  ABDOMEN: Soft, nontender, nondistended. No rebound, guarding, rigidity. Normoactive bowel sounds present in all four quadrants. No organomegaly or mass. EXTREMITIES: Full range of motion. No pedal edema, cyanosis, or clubbing. NEUROLOGIC: Cranial nerves II through XII are grossly intact with no focal sensorimotor deficit. Muscle strength 5/5 in all extremities. Sensation intact. Gait not checked. PSYCHIATRIC: The patient is alert and oriented x 1, person only.  SKIN: Warm, dry, and intact without obvious rash, lesion, or ulcer.  LABORATORY PANEL:  CBC  Recent Labs Lab 04/03/16 1909  WBC 12.6*  HGB 10.5*  HCT 31.6*  PLT 320    ----------------------------------------------------------------------------------------------------------------- Chemistries  Recent Labs Lab 04/03/16 2049  NA 132*  K 3.8  CL 97*  CO2 25  GLUCOSE 216*  BUN 12  CREATININE 0.75  CALCIUM 9.1   ------------------------------------------------------------------------------------------------------------------ Cardiac Enzymes  Recent Labs Lab 04/03/16 2049  TROPONINI 0.38*   ------------------------------------------------------------------------------------------------------------------  RADIOLOGY: Ct Head Wo Contrast  Result Date: 04/03/2016 CLINICAL DATA:  Unwitnessed fall, found on floor and bathroom, cool and pale when found, history Alzheimer's, diabetes mellitus, hypertension EXAM: CT HEAD WITHOUT CONTRAST CT CERVICAL SPINE WITHOUT CONTRAST TECHNIQUE: Multidetector CT imaging of the head and cervical spine was performed following the standard protocol without intravenous contrast. Multiplanar CT image reconstructions of the cervical spine were also generated. Motion artifacts on initial CT at imaging, for which repeat imaging was performed COMPARISON:  None; correlation MR brain 12/29/2008 FINDINGS: CT HEAD FINDINGS Brain: Generalized atrophy. Normal ventricular morphology. No midline shift or mass effect. Small vessel chronic ischemic changes of deep cerebral white matter. No intracranial hemorrhage, mass lesion, evidence of acute infarction, or extra-axial fluid collection. Vascular: Atherosclerotic calcifications at the carotid siphons Skull: Intact Sinuses/Orbits: Clear Other: N/A CT CERVICAL SPINE FINDINGS Alignment: Minimal retrolisthesis at C5-C6. Alignment otherwise normal Skull base and vertebrae: Skullbase intact. Vertebral body heights maintained. No fracture or bone destruction. Scattered facet degenerative changes. Soft tissues and spinal canal: Prevertebral soft tissues normal thickness. Atherosclerotic calcifications  in the carotid systems bilaterally. Disc levels: Disc  space narrowing with endplate spur formation at C5-C6 and C6-C7. Uncovertebral spurs LEFT C5-C6 and C6-C7 foramina. Upper chest: Lung apices clear Other: N/A IMPRESSION: Atrophy with small vessel chronic ischemic changes of deep cerebral white matter. No acute intracranial abnormalities. Scattered degenerative disc and facet disease changes of the cervical spine as above. No acute cervical spine abnormalities. Electronically Signed   By: Ulyses Southward M.D.   On: 04/03/2016 20:16   Ct Cervical Spine Wo Contrast  Result Date: 04/03/2016 CLINICAL DATA:  Unwitnessed fall, found on floor and bathroom, cool and pale when found, history Alzheimer's, diabetes mellitus, hypertension EXAM: CT HEAD WITHOUT CONTRAST CT CERVICAL SPINE WITHOUT CONTRAST TECHNIQUE: Multidetector CT imaging of the head and cervical spine was performed following the standard protocol without intravenous contrast. Multiplanar CT image reconstructions of the cervical spine were also generated. Motion artifacts on initial CT at imaging, for which repeat imaging was performed COMPARISON:  None; correlation MR brain 12/29/2008 FINDINGS: CT HEAD FINDINGS Brain: Generalized atrophy. Normal ventricular morphology. No midline shift or mass effect. Small vessel chronic ischemic changes of deep cerebral white matter. No intracranial hemorrhage, mass lesion, evidence of acute infarction, or extra-axial fluid collection. Vascular: Atherosclerotic calcifications at the carotid siphons Skull: Intact Sinuses/Orbits: Clear Other: N/A CT CERVICAL SPINE FINDINGS Alignment: Minimal retrolisthesis at C5-C6. Alignment otherwise normal Skull base and vertebrae: Skullbase intact. Vertebral body heights maintained. No fracture or bone destruction. Scattered facet degenerative changes. Soft tissues and spinal canal: Prevertebral soft tissues normal thickness. Atherosclerotic calcifications in the carotid systems  bilaterally. Disc levels: Disc space narrowing with endplate spur formation at C5-C6 and C6-C7. Uncovertebral spurs LEFT C5-C6 and C6-C7 foramina. Upper chest: Lung apices clear Other: N/A IMPRESSION: Atrophy with small vessel chronic ischemic changes of deep cerebral white matter. No acute intracranial abnormalities. Scattered degenerative disc and facet disease changes of the cervical spine as above. No acute cervical spine abnormalities. Electronically Signed   By: Ulyses Southward M.D.   On: 04/03/2016 20:16   Dg Hip Unilat With Pelvis 2-3 Views Left  Result Date: 04/03/2016 CLINICAL DATA:  Left hip pain after fall. EXAM: DG HIP (WITH OR WITHOUT PELVIS) 2-3V LEFT COMPARISON:  None. FINDINGS: There is no evidence of hip fracture or dislocation. There is no evidence of arthropathy or other focal bone abnormality. IMPRESSION: Normal left hip. Electronically Signed   By: Lupita Raider, M.D.   On: 04/03/2016 20:31    EKG: Normal sinus rhythm at 99 bpm with normal axis and nonspecific ST-T wave changes.   IMPRESSION AND PLAN:  This is a 75 y.o. female with a history of Alzheimer's dementia, diabetes, depression, hypertension, hyperlipidemia now being admitted with: 1. Non-ST elevation MI-admit to inpatient for IV heparin, beta blocker, aspirin, statin. We'll trend troponins, check lipids and TSH and request cardiology consultation for consideration of further intervention. 2. Systolic ejection murmur consistent with aortic stenosis-per family patient has not had a cardiac workup. Will obtain echocardiogram and carotid Dopplers. 3. Altered mental status from baseline, likely secondary to urinary tract infection-will treat with IV Rocephin, follow up urine cultures 4. Hyponatremia, mild-IV fluid hydration and recheck BMP in the a.m. 5. Normocytic Anemia, unclear if acute or chronic-we will follow up CBC in a.m. and in the meantime order B12, folate, iron studies and stool guaiac. 6. History of  Alzheimer's-continue Namenda and Aricept. We will use Ativan if needed for severe agitation. 7. History of hyperlipidemia-continue Lipitor 8. History of depression-continue Prozac 9. History of hypothyroidism-continue Synthroid History  of diabetes-hold metformin and Amaryl, Accu-Cheks before meals at bedtime with regular insulin sliding scale coverage. Continue aspirin, Imodium and vitamin C. PT evaluation for unwitnessed fall and fall precautions.   Diet/Nutrition: Nothing by mouth after midnight Fluids: IV normal saline DVT Px: Heparin SCDs and early ambulation Code Status: DO NOT RESUSCITATE-confirmed with patient's daughter who is her proxy and power of attorney   All the records are reviewed and case discussed with ED provider. Management plans discussed with the patient and/or family who express understanding and agree with plan of care.   TOTAL TIME TAKING CARE OF THIS PATIENT: 60 minutes.   Jerilee Space D.O. on 04/04/2016 at 1:26 AM Between 7am to 6pm - Pager - [phone] After 6pm go to [email or web address] - Social research officer, government [facility] [place] [facility] Office [phone] CC: Primary care physician; Jaclyn Shaggy, MD     Note: This dictation was prepared with Dragon dictation along with smaller phrase technology. Any transcriptional errors that result from this process are unintentional.

## 2016-04-04 NOTE — Progress Notes (Signed)
PT Cancellation Note  Patient Details Name: Glenda AlexanderCarolyn L Ziemann MRN: [id number] DOB: [birth year]   Cancelled Treatment:    Reason Eval/Treat Not Completed: Medical issues which prohibited therapy; Pt arrived overnight to ED and found to have Non-ST elevation MI and elevated troponin leveles.  Awaiting further cardiac work-up and additional troponin levels prior to initiating PT services.  Will continue to follow.   Chaniece Barbato A Dacian Orrico, PT 04/04/2016, 9:39 AM

## 2016-04-04 NOTE — Progress Notes (Signed)
Pharmacy Antibiotic Note  Glenda Vaughn is a 75 y.o. female admitted on 04/03/2016 with UTI.  Pharmacy has been consulted for ceftriaxone dosing.  Plan: Ceftriaxone 2 grams q 24 hours ordered.  Height: 5\' 8"  (172.7 cm) Weight: 122 lb 12.7 oz (55.7 kg) IBW/kg (Calculated) : 63.9  Temp (24hrs), Avg:98.5 F (36.9 C), Min:98.2 F (36.8 C), Max:98.7 F (37.1 C)   Recent Labs Lab 04/03/16 1909 04/03/16 2049  WBC 12.6*  --   CREATININE  --  0.75    Estimated Creatinine Clearance: 54.2 mL/min (by C-G formula based on SCr of 0.75 mg/dL).    No Known Allergies  Antimicrobials this admission: ceftriaxone  >>    >>   Dose adjustments this admission:   Microbiology results: 10/17 UCx: pending  10/17 MRSA PCR: pending    10/17 UA: LE(+) NO2(+) WBC TNTC  Thank you for allowing pharmacy to be a part of this patient's care.  Germaine Ripp S 04/04/2016 1:26 AM

## 2016-04-04 NOTE — Progress Notes (Signed)
*  PRELIMINARY RESULTS* Echocardiogram 2D Echocardiogram has been performed.  Glenda Vaughn, Glenda Vaughn 04/04/2016, 9:36 AM

## 2016-04-04 NOTE — Consult Note (Signed)
Cardiology Consultation Note  Patient ID: Glenda Vaughn, MRN: [id number], DOB/AGE: 1940/12/17 75 y.o. Admit date: 04/03/2016   Date of Consult: 04/04/2016 Primary Physician: Jaclyn Shaggy, MD Primary Cardiologist: New to [facility] Requesting Physician: Dr. Emmit Pomfret, DO  Chief Complaint: Fall Reason for Consult: Elevated troponin  HPI: 75 y.o. female with h/o severe Alzheimer's dementia, DM2, HTN, HLD, and hypothyroidism who was found down on the floor for an unknown time duration by her toilet after suffering an unwitnessed fall. Cardiology is consulted for minimally elevated troponin in the setting of possible rhabdomyolysis.  No previously known cardiac history. Note is taken from prior notes as patient has severe Alzheimer's dementia and there is no family present. Patient does not remember suffering a fall in her restroom on 10/17. She is not certain if she suffered LOC or hit her head. She does state she has not had any chest pain, SOB, diaphoresis, nausea, or vomiting. Her daughter found her on the floor after suffering an unwitnessed fall in her bathroom. It is unknown just how long she was on the floor. Upon the patient's arrival to [facility] they were found to have an UTI which IM has started ABX. Troponin was mildly elevated with a peak of 0.99. CK was not checked. SCr 0.75. K+ 3.8. Mg++ 1.8, TSH 5.239. Free T4 normal. ECG as below, CXR not done at time of admission. CT head none acute.    Past Medical History:  Diagnosis Date  . Alzheimer's dementia   . Depression   . Diabetes mellitus without complication (HCC)   . Hyperlipidemia   . Hypertension   . Thyroid disease       Most Recent Cardiac Studies: none   Surgical History: History reviewed. No pertinent surgical history.   Home Meds: Prior to Admission medications   Medication Sig Start Date End Date Taking? Authorizing Provider  aspirin 81 MG chewable tablet Chew 81 mg by mouth daily.   Yes Historical Provider, MD    atorvastatin (LIPITOR) 20 MG tablet Take 20 mg by mouth daily at 6 PM.   Yes Historical Provider, MD  Calcium Carbonate-Vitamin D3 (CALCIUM 600-D) 600-400 MG-UNIT TABS Take 1 tablet by mouth daily.   Yes Historical Provider, MD  Diphenhydramine-APAP, sleep, (TYLENOL PM EXTRA STRENGTH PO) Take 1 tablet by mouth every evening.   Yes Historical Provider, MD  donepezil (ARICEPT) 10 MG tablet Take 10 mg by mouth daily.   Yes Historical Provider, MD  FLUoxetine (PROZAC) 20 MG capsule Take 20 mg by mouth daily.   Yes Historical Provider, MD  glimepiride (AMARYL) 2 MG tablet Take 2 mg by mouth daily with breakfast.   Yes Historical Provider, MD  levothyroxine (SYNTHROID, LEVOTHROID) 112 MCG tablet Take 112 mcg by mouth daily before breakfast.   Yes Historical Provider, MD  loperamide (IMODIUM) 2 MG capsule Take 2 mg by mouth as needed for diarrhea or loose stools.   Yes Historical Provider, MD  memantine (NAMENDA) 10 MG tablet Take 10 mg by mouth 2 (two) times daily.   Yes Historical Provider, MD  metFORMIN (GLUCOPHAGE-XR) 500 MG 24 hr tablet Take 500 mg by mouth 2 (two) times daily.   Yes Historical Provider, MD  Multiple Vitamin (MULTIVITAMIN) tablet Take 1 tablet by mouth daily.   Yes Historical Provider, MD  naproxen sodium (ANAPROX) 220 MG tablet Take 220 mg by mouth 2 (two) times daily with a meal.   Yes Historical Provider, MD  Probiotic Product (ALIGN) 4 MG CAPS Take 4 mg  by mouth daily.   Yes Historical Provider, MD  vitamin C (ASCORBIC ACID) 500 MG tablet Take 500 mg by mouth daily.   Yes Historical Provider, MD    Inpatient Medications:  . diphenhydrAMINE  25 mg Oral QHS   And  . acetaminophen  500 mg Oral QHS  . acidophilus  1 capsule Oral BID  . aspirin  81 mg Oral Daily  . atorvastatin  20 mg Oral q1800  . calcium-vitamin D  1 tablet Oral Daily  . cefTRIAXone  1 g Intravenous Q24H  . donepezil  10 mg Oral Daily  . FLUoxetine  20 mg Oral Daily  . insulin aspart  0-5 Units  Subcutaneous QHS  . insulin aspart  0-9 Units Subcutaneous TID WC  . levothyroxine  112 mcg Oral QAC breakfast  . memantine  10 mg Oral BID  . metoprolol  5 mg Intravenous Once  . metoprolol tartrate  12.5 mg Oral BID  . multivitamin with minerals  1 tablet Oral Daily  . nitroGLYCERIN  0.5 inch Topical Q8H  . vitamin C  500 mg Oral Daily   . 0.9 % NaCl with KCl 20 mEq / L    . heparin 650 Units/hr (04/04/16 0313)    Allergies: No Known Allergies  Social History   Social History  . Marital status: Widowed    Spouse name: N/A  . Number of children: N/A  . Years of education: N/A   Occupational History  . Not on file.   Social History Main Topics  . Smoking status: Never Smoker  . Smokeless tobacco: Never Used  . Alcohol use No  . Drug use: No  . Sexual activity: Not on file   Other Topics Concern  . Not on file   Social History Narrative  . No narrative on file     Family History  Problem Relation Age of Onset  . Family history unknown: Yes   Unable to obtain family history given severe dementia   Review of Systems: Review of Systems  Unable to perform ROS: Dementia    Labs:  Recent Labs  04/03/16 2049 04/04/16 0116 04/04/16 0807  TROPONINI 0.38* 0.99* 0.92*   Lab Results  Component Value Date   WBC 10.5 04/04/2016   HGB 11.0 (L) 04/04/2016   HCT 32.4 (L) 04/04/2016   MCV 85.5 04/04/2016   PLT 328 04/04/2016     Recent Labs Lab 04/04/16 0807  NA 134*  K 3.5  CL 99*  CO2 27  BUN 9  CREATININE 0.59  CALCIUM 8.7*  GLUCOSE 160*   Lab Results  Component Value Date   CHOL 154 04/04/2016   HDL 83 04/04/2016   LDLCALC 59 04/04/2016   TRIG 61 04/04/2016   No results found for: DDIMER  Radiology/Studies:  Ct Head Wo Contrast  Result Date: 04/03/2016 CLINICAL DATA:  Unwitnessed fall, found on floor and bathroom, cool and pale when found, history Alzheimer's, diabetes mellitus, hypertension EXAM: CT HEAD WITHOUT CONTRAST CT CERVICAL  SPINE WITHOUT CONTRAST TECHNIQUE: Multidetector CT imaging of the head and cervical spine was performed following the standard protocol without intravenous contrast. Multiplanar CT image reconstructions of the cervical spine were also generated. Motion artifacts on initial CT at imaging, for which repeat imaging was performed COMPARISON:  None; correlation MR brain 12/29/2008 FINDINGS: CT HEAD FINDINGS Brain: Generalized atrophy. Normal ventricular morphology. No midline shift or mass effect. Small vessel chronic ischemic changes of deep cerebral white matter. No intracranial hemorrhage, mass  lesion, evidence of acute infarction, or extra-axial fluid collection. Vascular: Atherosclerotic calcifications at the carotid siphons Skull: Intact Sinuses/Orbits: Clear Other: N/A CT CERVICAL SPINE FINDINGS Alignment: Minimal retrolisthesis at C5-C6. Alignment otherwise normal Skull base and vertebrae: Skullbase intact. Vertebral body heights maintained. No fracture or bone destruction. Scattered facet degenerative changes. Soft tissues and spinal canal: Prevertebral soft tissues normal thickness. Atherosclerotic calcifications in the carotid systems bilaterally. Disc levels: Disc space narrowing with endplate spur formation at C5-C6 and C6-C7. Uncovertebral spurs LEFT C5-C6 and C6-C7 foramina. Upper chest: Lung apices clear Other: N/A IMPRESSION: Atrophy with small vessel chronic ischemic changes of deep cerebral white matter. No acute intracranial abnormalities. Scattered degenerative disc and facet disease changes of the cervical spine as above. No acute cervical spine abnormalities. Electronically Signed   By: Ulyses SouthwardMark  Boles M.D.   On: 04/03/2016 20:16   Ct Cervical Spine Wo Contrast  Result Date: 04/03/2016 CLINICAL DATA:  Unwitnessed fall, found on floor and bathroom, cool and pale when found, history Alzheimer's, diabetes mellitus, hypertension EXAM: CT HEAD WITHOUT CONTRAST CT CERVICAL SPINE WITHOUT CONTRAST  TECHNIQUE: Multidetector CT imaging of the head and cervical spine was performed following the standard protocol without intravenous contrast. Multiplanar CT image reconstructions of the cervical spine were also generated. Motion artifacts on initial CT at imaging, for which repeat imaging was performed COMPARISON:  None; correlation MR brain 12/29/2008 FINDINGS: CT HEAD FINDINGS Brain: Generalized atrophy. Normal ventricular morphology. No midline shift or mass effect. Small vessel chronic ischemic changes of deep cerebral white matter. No intracranial hemorrhage, mass lesion, evidence of acute infarction, or extra-axial fluid collection. Vascular: Atherosclerotic calcifications at the carotid siphons Skull: Intact Sinuses/Orbits: Clear Other: N/A CT CERVICAL SPINE FINDINGS Alignment: Minimal retrolisthesis at C5-C6. Alignment otherwise normal Skull base and vertebrae: Skullbase intact. Vertebral body heights maintained. No fracture or bone destruction. Scattered facet degenerative changes. Soft tissues and spinal canal: Prevertebral soft tissues normal thickness. Atherosclerotic calcifications in the carotid systems bilaterally. Disc levels: Disc space narrowing with endplate spur formation at C5-C6 and C6-C7. Uncovertebral spurs LEFT C5-C6 and C6-C7 foramina. Upper chest: Lung apices clear Other: N/A IMPRESSION: Atrophy with small vessel chronic ischemic changes of deep cerebral white matter. No acute intracranial abnormalities. Scattered degenerative disc and facet disease changes of the cervical spine as above. No acute cervical spine abnormalities. Electronically Signed   By: Ulyses SouthwardMark  Boles M.D.   On: 04/03/2016 20:16   Dg Hip Unilat With Pelvis 2-3 Views Left  Result Date: 04/03/2016 CLINICAL DATA:  Left hip pain after fall. EXAM: DG HIP (WITH OR WITHOUT PELVIS) 2-3V LEFT COMPARISON:  None. FINDINGS: There is no evidence of hip fracture or dislocation. There is no evidence of arthropathy or other focal bone  abnormality. IMPRESSION: Normal left hip. Electronically Signed   By: Lupita RaiderJames  Green Jr, M.D.   On: 04/03/2016 20:31    EKG: Interpreted by me showed: NSR, 89 bpm, no acute st/t changes  Telemetry: Interpreted by me showed: NSR, 70's bpm  Weights: Filed Weights   04/03/16 1905  Weight: 122 lb 12.7 oz (55.7 kg)     Physical Exam: Blood pressure (!) 110/59, pulse 71, temperature 98.5 F (36.9 C), temperature source Oral, resp. rate 18, height 5\' 8"  (1.727 m), weight 122 lb 12.7 oz (55.7 kg), SpO2 96 %. Body mass index is 18.67 kg/m. General: Well developed, well nourished, in no acute distress. Head: Normocephalic, atraumatic, sclera non-icteric, no xanthomas, nares are without discharge.  Neck: Negative for carotid bruits. JVD  not elevated. Lungs: Clear bilaterally to auscultation without wheezes, rales, or rhonchi. Breathing is unlabored. Heart: RRR with S1 S2. II/VI systolic murmur RUSB, no rubs, or gallops appreciated. Abdomen: Soft, non-tender, non-distended with normoactive bowel sounds. No hepatomegaly. No rebound/guarding. No obvious abdominal masses. Msk:  Strength and tone appear normal for age. Extremities: No clubbing or cyanosis. No edema. Distal pedal pulses are 2+ and equal bilaterally. Neuro: Alert. No facial asymmetry. No focal deficit. Moves all extremities spontaneously. Psych:  Responds to questions with a normal affect.    Assessment and Plan:  Principal Problem:   Fall Active Problems:   Elevated troponin   Alzheimer's dementia   NSTEMI (non-ST elevated myocardial infarction) (HCC)    1. Fall with possible rhabdomyolysis: -Add CK and CKMB -Unwitnessed with uncertain circumstances -Was found down at the toilet, possible post micturation syncope vs mechanical fall -Monitor on telemetry -If concern for arrhythmia could use outpatient Zio patch attached to her back to monitor for arrhythmia -IV fluids  2. Elevated troponin: -Likely in the setting of  demand ischemia given her fall -CK pending to evaluate for rhabdomyolysis  -Never with chest pain, SOB, dizziness (though she does have dementia) -Echo with normal EF -No plans for ischemic evaluation at this time given mildly elevated troponin in the above setting with underlying severe Alzheimer's dementia  -Can stop heparin gtt -Continue ASA and Lopressor  3. Aortic stenosis: -Mild by echo -Monitor with periodic echo  4. UTI: -Per IM  5. Alzheimer's dementia: -Per IM   Signed, Eula Listen, PA-C [facility] Pager: [phone] 04/04/2016, 10:05 AM

## 2016-04-04 NOTE — Progress Notes (Signed)
Pt arrived from ED with family at bedside. Pt is Alert and oriented to person. Pt has tried to get out of bed to "go down the hall to her bed". Pt is calm but confused. No c/o pain, no concerns offered.No skin issues, NS running at 75 ml/h to left AC IV. Second troponin 0.99 up from 0.38. MD made aware. 3400 bolus of heparin given, drip running at 6.5 ml/h. Will continue to monitor

## 2016-04-04 NOTE — Progress Notes (Signed)
ANTICOAGULATION CONSULT NOTE - Initial Consult  Pharmacy Consult for heparin drip Indication: ACS/STEMI  No Known Allergies  Patient Measurements: Height: 5\' 8"  (172.7 cm) Weight: 122 lb 12.7 oz (55.7 kg) IBW/kg (Calculated) : 63.9 Heparin Dosing Weight: 55.7 kg  Vital Signs: Temp: 98.7 F (37.1 C) (10/18 0107) Temp Source: Oral (10/18 0107) BP: 147/65 (10/18 0107) Pulse Rate: 93 (10/18 0107)  Labs:  Recent Labs  04/03/16 1909 04/03/16 2049 04/04/16 0116 04/04/16 0210  HGB 10.5*  --   --   --   HCT 31.6*  --   --   --   PLT 320  --   --   --   APTT  --   --   --  29  LABPROT  --   --   --  13.4  INR  --   --   --  1.02  CREATININE  --  0.75  --   --   TROPONINI  --  0.38* 0.99*  --     Estimated Creatinine Clearance: 54.2 mL/min (by C-G formula based on SCr of 0.75 mg/dL).   Medical History: Past Medical History:  Diagnosis Date  . Alzheimer's dementia   . Depression   . Diabetes mellitus without complication (HCC)   . Hyperlipidemia   . Hypertension   . Thyroid disease     Medications:  No anticoagulation in PTA meds  Assessment:  Goal of Therapy:  Heparin level 0.3-0.7 units/ml Monitor platelets by anticoagulation protocol: Yes   Plan:  3400 unit bolus and initial rate of 650 units/hr. First heparin level 8 hours after start of infusion.  Viggo Perko S 04/04/2016,3:22 AM

## 2016-04-04 NOTE — Clinical Social Work Note (Signed)
[facility]  MSW will meet with patient and complete assessment at a later time.  [facility] for [facility] PT.  [facility] have a Physical Therapist who does home health at ALF, his name is Gerhard PerchesJeff Shupe, his phone number is [phone] and fax number is [phone], MSW to inform case manager.  Ervin KnackEric R. Gabrielle Mester, MSW [phone]  Mon-Fri 8a-4:30p 04/04/2016 5:12 PM

## 2016-04-04 NOTE — ED Notes (Signed)
Pt agitated and climbing out of bed - per Dr Manson PasseyBrown gave Ativan 1mg  - also gave crackers with peanut butter and a diet coke

## 2016-04-04 NOTE — Care Management Obs Status (Deleted)
MEDICARE OBSERVATION STATUS NOTIFICATION   Patient Details  Name: Glenda Vaughn MRN: [id number] Date of Birth: 05/15/1941   Medicare Observation Status Notification Given:       Marily MemosLisa M Sayuri Rhames, RN 04/04/2016, 12:13 PM

## 2016-04-04 NOTE — Evaluation (Signed)
Physical Therapy Evaluation Patient Details Name: Glenda Vaughn MRN: [id number] DOB: 12-29-1940 Today's Date: 04/04/2016   History of Present Illness  Glenda Vaughn is a 75 y.o. female with a known history of Alzheimer's dementia, diabetes, depression, hypertension, hyperlipidemia presents to the emergency department for evaluation of a fall.   Clinical Impression  Pt presents to PT near baseline functional mobility.  Pt able to get in/out of bed with supervision assist and transfers sit<>stand and from bed>chair with supervision.  Pt ambulating 150' without device and supervision with decreased cadence and cruising along furniture (counters/walls).  Pt would benefit from cane for safety with ambulation but pt stating she will not use one, and family endorsing pt most likely would not use an assistive device.  Recommend HHPT to follow up with pt to assess further assess pt's safety in home setting and continue with education on use of assistive device.  Pt agreeable to HHPT.  Nor further acute PT planned as pt with impending discharge back to assistive living today.    Follow Up Recommendations Home health PT    Equipment Recommendations  Cane    Recommendations for Other Services       Precautions / Restrictions Precautions Precautions: Fall Restrictions Weight Bearing Restrictions: No      Mobility  Bed Mobility Overal bed mobility: Needs Assistance Bed Mobility: Supine to Sit;Sit to Supine     Supine to sit: Min guard Sit to supine: Modified independent (Device/Increase time)   General bed mobility comments: Increased time to rise to sitting, verbal cues to attend to task as easily distracted.  Transfers Overall transfer level: Needs assistance Equipment used: None Transfers: Sit to/from Stand Sit to Stand: Supervision         General transfer comment: Slow to rise/ lift off from seated surface  Ambulation/Gait Ambulation/Gait assistance:  Supervision Ambulation Distance (Feet): 150 Feet Assistive device: None Gait Pattern/deviations: Step-through pattern;Decreased step length - left;Antalgic Gait velocity: decreased   General Gait Details: Cruising in hallways using counter or wall or light support; slow, steady gait with L LE externall rotated and decreased foot clearance.  Stairs            Wheelchair Mobility    Modified Rankin (Stroke Patients Only)       Balance Overall balance assessment: Modified Independent                                           Pertinent Vitals/Pain Pain Assessment: No/denies pain    Home Living Family/patient expects to be discharged to:: Assisted living               Home Equipment: None      Prior Function Level of Independence: Needs assistance   Gait / Transfers Assistance Needed: Independent without device for household distances; able to transfer in/out of car to go to day care.  ADL's / Homemaking Assistance Needed: assist from care staff  Comments: lives on locked unit due to dementia; pt trained as PT     Hand Dominance        Extremity/Trunk Assessment   Upper Extremity Assessment: Overall WFL for tasks assessed           Lower Extremity Assessment: Overall WFL for tasks assessed         Communication   Communication: No difficulties  Cognition Arousal/Alertness: Awake/alert Behavior During Therapy: Restless  Overall Cognitive Status: History of cognitive impairments - at baseline                      General Comments      Exercises Other Exercises Other Exercises: Supine: AP, QS, HS, SLR and bridging x 10 reps   Assessment/Plan    PT Assessment All further PT needs can be met in the next venue of care  PT Problem List Decreased strength;Decreased safety awareness          PT Treatment Interventions      PT Goals (Current goals can be found in the Care Plan section)  Acute Rehab PT  Goals Patient Stated Goal: None stated PT Goal Formulation: With patient    Frequency     Barriers to discharge        Co-evaluation               End of Session Equipment Utilized During Treatment: Gait belt Activity Tolerance: Patient tolerated treatment well Patient left: in chair;with call bell/phone within reach;with chair alarm set;with nursing/sitter in room Nurse Communication: Mobility status    Functional Assessment Tool Used: clinical judgement Functional Limitation: Mobility: Walking and moving around Mobility: Walking and Moving Around Current Status (Q1975): At least 1 percent but less than 20 percent impaired, limited or restricted Mobility: Walking and Moving Around Goal Status [phone]): At least 1 percent but less than 20 percent impaired, limited or restricted Mobility: Walking and Moving Around Discharge Status [phone]): At least 1 percent but less than 20 percent impaired, limited or restricted    Time: 1158-1228 PT Time Calculation (min) (ACUTE ONLY): 30 min   Charges:   PT Evaluation $PT Eval Low Complexity: 1 Procedure PT Treatments $Therapeutic Exercise: 8-22 mins   PT G Codes:   PT G-Codes **NOT FOR INPATIENT CLASS** Functional Assessment Tool Used: clinical judgement Functional Limitation: Mobility: Walking and moving around Mobility: Walking and Moving Around Current Status (E1583): At least 1 percent but less than 20 percent impaired, limited or restricted Mobility: Walking and Moving Around Goal Status [phone]): At least 1 percent but less than 20 percent impaired, limited or restricted Mobility: Walking and Moving Around Discharge Status [phone]): At least 1 percent but less than 20 percent impaired, limited or restricted    [employer], PT 04/04/2016, 12:38 PM

## 2016-04-04 NOTE — Progress Notes (Signed)
Patient off unit for cardiac testing. Will resume care / pass medications upon return. Glenda FavreSteven M [facility]

## 2016-04-04 NOTE — Care Management (Signed)
[facility] Spoke with daughter. [facility]. She gets her self out of bed and gets dressed. She is able to feed herself and plays cards with friends. After some discussion with daughter the patient should have no needs as discharge. CSW updated and daughter would like an update from CSW.

## 2016-04-04 NOTE — Care Management Note (Signed)
Case Management Note  Patient Details  Name: Glenda Vaughn MRN: [id number] Date of Birth: Jan 26, 1941  Subjective/Objective:  PT recommending home health PT. Daughter agreeable. Referral to [facility] for home health PT. CSW to send order with patient at DC.                  Action/Plan:   Expected Discharge Date:    04/04/2016              Expected Discharge Plan:  Home w Home Health Services  In-House Referral:     Discharge planning Services  CM Consult  Post Acute Care Choice:  Home Health Choice offered to:  Adult Children  DME Arranged:    DME Agency:     HH Arranged:  PT HH Agency:  [facility]  Status of Service:  Completed, signed off  If discussed at Long Length of Stay Meetings, dates discussed:    Additional Comments:  Marily MemosLisa M Korde Jeppsen, RN 04/04/2016, 12:47 PM

## 2016-04-04 NOTE — Discharge Summary (Signed)
[facility] - [place] at [facility]   PATIENT NAME: Glenda Vaughn    MR#:  [id number]  DATE OF BIRTH:  Nov 05, 1940  DATE OF ADMISSION:  04/03/2016 ADMITTING PHYSICIAN: Jon Gills Hugelmeyer, DO  DATE OF DISCHARGE: No discharge date for patient encounter.  PRIMARY CARE PHYSICIAN: Jaclyn Shaggy, MD     ADMISSION DIAGNOSIS:  Fall [W19.XXXA] Elevated troponin [R74.8] Urinary tract infection with hematuria, site unspecified [N39.0, R31.9]  DISCHARGE DIAGNOSIS:  Principal Problem:   Fall Active Problems:   Generalized weakness   Acute cystitis without hematuria   Elevated troponin   Iron deficiency anemia   Hyponatremia   Alzheimer's dementia   Mild aortic stenosis   SECONDARY DIAGNOSIS:   Past Medical History:  Diagnosis Date  . Alzheimer's dementia   . Depression   . Diabetes mellitus without complication (HCC)   . Hyperlipidemia   . Hypertension   . Thyroid disease     .pro HOSPITAL COURSE:   The patient is 75 year old Caucasian female with past history significant for history of advanced dementia, depression, diabetes mellitus, hypertension, hyperlipidemia, thyroid disease, who presents to the hospital with complaints of fall. Patient was found to be awake and conscious, she had no complaints, however, because of dementia. She was not able to do total circumstances surrounding the fall. On arrival to the hospital, labs revealed hyponatremia, mild elevation of troponin to a maximum level of 0.99, hemoglobin level of 11.0. Iron studies revealed severe iron deficiency with iron saturation of 3, iron level of 11, ferritin level was 24, folate acid level was 21.6. B12 was 216. TSH was found to be high at 5.239, however, patient's free T4 was normal at 0.96, signifying sick euthyroid. Urinalysis revealed too numerous to count white blood cells, white blood cell clumps, positive for nitrites, 2+ leukocyte esterase. Urine cultures were obtained, pending. The  patient was admitted to the hospital, initiated on IV fluids, antibiotic with Rocephin. Her condition overall improved. Her sodium level improved to 134 from 132. She was seen by physical therapist and recommended home health services, which are going to be recommended for her upon discharge. She was also seen by cardiologist, who felt that patient's mild troponin elevation was likely demand ischemia. Echocardiogram was performed revealing mild aortic stenosis, normal ejection fraction, however, official result was not yet available. It was felt that patient is stable to be discharged back home with home health services and antibiotic therapy orally. It is recommended. However, to follow patient's urinary cultures to ensure that bacteria sensitive to Keflex. Discussion by problem: #1. Fall, likely due to generalized weakness, related to urinary tract infection. Home health services will be prescribed to her upon discharge #2. Generalized weakness, likely due to her short infection, mild dehydration, home health physical therapy services will be arranged upon discharge as above #3. Elevated troponin, echocardiogram, per Mr. Shea Evans, cardiology PA, revealed normal ejection fraction, mild aortic stenosis, which needs to be followed as outpatient by cardiologist, no further workup was recommended #4 urinary tract infection, cultures are pending, patient is to continue antibiotic therapy with Keflex, it is recommended to follow patient's urinary culture results to ensure that bacteria is sensitive to Keflex #5, Alzheimer's dementia, supportive therapy #6. Iron deficiency anemia, Hemoccult is recommended as outpatient, and outpatient gastroenterology follow-up. If patient's family desires #7. Hyponatremia, likely mild dehydration, improved with IV fluid administration, it is recommended to follow patient's sodium level as outpatient and advance oral fluid intake if possible  DISCHARGE CONDITIONS:  Stable  CONSULTS OBTAINED:  Treatment Team:  Yvonne Kendallhristopher End, MD  DRUG ALLERGIES:  No Known Allergies  DISCHARGE MEDICATIONS:   Current Discharge Medication List    START taking these medications   Details  cephALEXin (KEFLEX) 500 MG capsule Take 1 capsule (500 mg total) by mouth 3 (three) times daily. Qty: 15 capsule, Refills: 0      CONTINUE these medications which have NOT CHANGED   Details  aspirin 81 MG chewable tablet Chew 81 mg by mouth daily.    atorvastatin (LIPITOR) 20 MG tablet Take 20 mg by mouth daily at 6 PM.    Calcium Carbonate-Vitamin D3 (CALCIUM 600-D) 600-400 MG-UNIT TABS Take 1 tablet by mouth daily.    Diphenhydramine-APAP, sleep, (TYLENOL PM EXTRA STRENGTH PO) Take 1 tablet by mouth every evening.    donepezil (ARICEPT) 10 MG tablet Take 10 mg by mouth daily.    FLUoxetine (PROZAC) 20 MG capsule Take 20 mg by mouth daily.    glimepiride (AMARYL) 2 MG tablet Take 2 mg by mouth daily with breakfast.    levothyroxine (SYNTHROID, LEVOTHROID) 112 MCG tablet Take 112 mcg by mouth daily before breakfast.    loperamide (IMODIUM) 2 MG capsule Take 2 mg by mouth as needed for diarrhea or loose stools.    memantine (NAMENDA) 10 MG tablet Take 10 mg by mouth 2 (two) times daily.    metFORMIN (GLUCOPHAGE-XR) 500 MG 24 hr tablet Take 500 mg by mouth 2 (two) times daily.    Multiple Vitamin (MULTIVITAMIN) tablet Take 1 tablet by mouth daily.    naproxen sodium (ANAPROX) 220 MG tablet Take 220 mg by mouth 2 (two) times daily with a meal.    Probiotic Product (ALIGN) 4 MG CAPS Take 4 mg by mouth daily.    vitamin C (ASCORBIC ACID) 500 MG tablet Take 500 mg by mouth daily.         DISCHARGE INSTRUCTIONS:    Patient is to follow-up with primary care physician as outpatient, she is recommended to follow up with cardiologist as outpatient as well  If you experience worsening of your admission symptoms, develop shortness of breath, life threatening  emergency, suicidal or homicidal thoughts you must seek medical attention immediately by calling 911 or calling your MD immediately  if symptoms less severe.  You Must read complete instructions/literature along with all the possible adverse reactions/side effects for all the Medicines you take and that have been prescribed to you. Take any new Medicines after you have completely understood and accept all the possible adverse reactions/side effects.   Please note  You were cared for by a hospitalist during your hospital stay. If you have any questions about your discharge medications or the care you received while you were in the hospital after you are discharged, you can call the unit and asked to speak with the hospitalist on call if the hospitalist that took care of you is not available. Once you are discharged, your primary care physician will handle any further medical issues. Please note that NO REFILLS for any discharge medications will be authorized once you are discharged, as it is imperative that you return to your primary care physician (or establish a relationship with a primary care physician if you do not have one) for your aftercare needs so that they can reassess your need for medications and monitor your lab values.    Today   CHIEF COMPLAINT:   Chief Complaint  Patient presents with  . Fall  HISTORY OF PRESENT ILLNESS:  Amariona Rathje  is a 75 y.o. female with a known history of advanced dementia, depression, diabetes mellitus, hypertension, hyperlipidemia, thyroid disease, who presents to the hospital with complaints of fall. Patient was found to be awake and conscious, she had no complaints, however, because of dementia. She was not able to do total circumstances surrounding the fall. On arrival to the hospital, labs revealed hyponatremia, mild elevation of troponin to a maximum level of 0.99, hemoglobin level of 11.0. Iron studies revealed severe iron deficiency with iron  saturation of 3, iron level of 11, ferritin level was 24, folate acid level was 21.6. B12 was 216. TSH was found to be high at 5.239, however, patient's free T4 was normal at 0.96, signifying sick euthyroid. Urinalysis revealed too numerous to count white blood cells, white blood cell clumps, positive for nitrites, 2+ leukocyte esterase. Urine cultures were obtained, pending. The patient was admitted to the hospital, initiated on IV fluids, antibiotic with Rocephin. Her condition overall improved. Her sodium level improved to 134 from 132. She was seen by physical therapist and recommended home health services, which are going to be recommended for her upon discharge. She was also seen by cardiologist, who felt that patient's mild troponin elevation was likely demand ischemia. Echocardiogram was performed revealing mild aortic stenosis, normal ejection fraction, however, official result was not yet available. It was felt that patient is stable to be discharged back home with home health services and antibiotic therapy orally. It is recommended. However, to follow patient's urinary cultures to ensure that bacteria sensitive to Keflex. Discussion by problem: #1. Fall, likely due to generalized weakness, related to urinary tract infection. Home health services will be prescribed to her upon discharge #2. Generalized weakness, likely due to her short infection, mild dehydration, home health physical therapy services will be arranged upon discharge as above #3. Elevated troponin, echocardiogram, per Mr. Shea Evans, cardiology PA, revealed normal ejection fraction, mild aortic stenosis, which needs to be followed as outpatient by cardiologist, no further workup was recommended #4 urinary tract infection, cultures are pending, patient is to continue antibiotic therapy with Keflex, it is recommended to follow patient's urinary culture results to ensure that bacteria is sensitive to Keflex #5, Alzheimer's dementia,  supportive therapy #6. Iron deficiency anemia, Hemoccult is recommended as outpatient, and outpatient gastroenterology follow-up. If patient's family desires #7. Hyponatremia, likely mild dehydration, improved with IV fluid administration, it is recommended to follow patient's sodium level as outpatient and advance oral fluid intake if possible     VITAL SIGNS:  Blood pressure (!) 107/56, pulse 72, temperature 98.3 F (36.8 C), temperature source Oral, resp. rate 12, height 5\' 8"  (1.727 m), weight 55.7 kg (122 lb 12.7 oz), SpO2 98 %.  I/O:   Intake/Output Summary (Last 24 hours) at 04/04/16 1250 Last data filed at 04/04/16 1155  Gross per 24 hour  Intake            71.25 ml  Output              500 ml  Net          -428.75 ml    PHYSICAL EXAMINATION:  GENERAL:  75 y.o.-year-old patient lying in the bed with no acute distress.  EYES: Pupils equal, round, reactive to light and accommodation. No scleral icterus. Extraocular muscles intact.  HEENT: Head atraumatic, normocephalic. Oropharynx and nasopharynx clear.  NECK:  Supple, no jugular venous distention. No thyroid enlargement, no tenderness.  LUNGS: Normal  breath sounds bilaterally, no wheezing, rales,rhonchi or crepitation. No use of accessory muscles of respiration.  CARDIOVASCULAR: S1, S2 normal. No murmurs, rubs, or gallops.  ABDOMEN: Soft, non-tender, non-distended. Bowel sounds present. No organomegaly or mass.  EXTREMITIES: No pedal edema, cyanosis, or clubbing.  NEUROLOGIC: Cranial nerves II through XII are intact. Muscle strength 5/5 in all extremities. Sensation intact. Gait not checked.  PSYCHIATRIC: The patient is alert and oriented x 3.  SKIN: No obvious rash, lesion, or ulcer.   DATA REVIEW:   CBC  Recent Labs Lab 04/04/16 0807  WBC 10.5  HGB 11.0*  HCT 32.4*  PLT 328    Chemistries   Recent Labs Lab 04/04/16 0116 04/04/16 0807  NA  --  134*  K  --  3.5  CL  --  99*  CO2  --  27  GLUCOSE  --   160*  BUN  --  9  CREATININE  --  0.59  CALCIUM  --  8.7*  MG 1.8  --     Cardiac Enzymes  Recent Labs Lab 04/04/16 0807  TROPONINI 0.92*    Microbiology Results  Results for orders placed or performed during the hospital encounter of 04/03/16  MRSA PCR Screening     Status: None   Collection Time: 04/04/16  1:18 AM  Result Value Ref Range Status   MRSA by PCR NEGATIVE NEGATIVE Final    Comment:        The GeneXpert MRSA Assay (FDA approved for NASAL specimens only), is one component of a comprehensive MRSA colonization surveillance program. It is not intended to diagnose MRSA infection nor to guide or monitor treatment for MRSA infections.     RADIOLOGY:  Ct Head Wo Contrast  Result Date: 04/03/2016 CLINICAL DATA:  Unwitnessed fall, found on floor and bathroom, cool and pale when found, history Alzheimer's, diabetes mellitus, hypertension EXAM: CT HEAD WITHOUT CONTRAST CT CERVICAL SPINE WITHOUT CONTRAST TECHNIQUE: Multidetector CT imaging of the head and cervical spine was performed following the standard protocol without intravenous contrast. Multiplanar CT image reconstructions of the cervical spine were also generated. Motion artifacts on initial CT at imaging, for which repeat imaging was performed COMPARISON:  None; correlation MR brain 12/29/2008 FINDINGS: CT HEAD FINDINGS Brain: Generalized atrophy. Normal ventricular morphology. No midline shift or mass effect. Small vessel chronic ischemic changes of deep cerebral white matter. No intracranial hemorrhage, mass lesion, evidence of acute infarction, or extra-axial fluid collection. Vascular: Atherosclerotic calcifications at the carotid siphons Skull: Intact Sinuses/Orbits: Clear Other: N/A CT CERVICAL SPINE FINDINGS Alignment: Minimal retrolisthesis at C5-C6. Alignment otherwise normal Skull base and vertebrae: Skullbase intact. Vertebral body heights maintained. No fracture or bone destruction. Scattered facet  degenerative changes. Soft tissues and spinal canal: Prevertebral soft tissues normal thickness. Atherosclerotic calcifications in the carotid systems bilaterally. Disc levels: Disc space narrowing with endplate spur formation at C5-C6 and C6-C7. Uncovertebral spurs LEFT C5-C6 and C6-C7 foramina. Upper chest: Lung apices clear Other: N/A IMPRESSION: Atrophy with small vessel chronic ischemic changes of deep cerebral white matter. No acute intracranial abnormalities. Scattered degenerative disc and facet disease changes of the cervical spine as above. No acute cervical spine abnormalities. Electronically Signed   By: Ulyses Southward M.D.   On: 04/03/2016 20:16   Ct Cervical Spine Wo Contrast  Result Date: 04/03/2016 CLINICAL DATA:  Unwitnessed fall, found on floor and bathroom, cool and pale when found, history Alzheimer's, diabetes mellitus, hypertension EXAM: CT HEAD WITHOUT CONTRAST CT CERVICAL SPINE WITHOUT CONTRAST  TECHNIQUE: Multidetector CT imaging of the head and cervical spine was performed following the standard protocol without intravenous contrast. Multiplanar CT image reconstructions of the cervical spine were also generated. Motion artifacts on initial CT at imaging, for which repeat imaging was performed COMPARISON:  None; correlation MR brain 12/29/2008 FINDINGS: CT HEAD FINDINGS Brain: Generalized atrophy. Normal ventricular morphology. No midline shift or mass effect. Small vessel chronic ischemic changes of deep cerebral white matter. No intracranial hemorrhage, mass lesion, evidence of acute infarction, or extra-axial fluid collection. Vascular: Atherosclerotic calcifications at the carotid siphons Skull: Intact Sinuses/Orbits: Clear Other: N/A CT CERVICAL SPINE FINDINGS Alignment: Minimal retrolisthesis at C5-C6. Alignment otherwise normal Skull base and vertebrae: Skullbase intact. Vertebral body heights maintained. No fracture or bone destruction. Scattered facet degenerative changes. Soft  tissues and spinal canal: Prevertebral soft tissues normal thickness. Atherosclerotic calcifications in the carotid systems bilaterally. Disc levels: Disc space narrowing with endplate spur formation at C5-C6 and C6-C7. Uncovertebral spurs LEFT C5-C6 and C6-C7 foramina. Upper chest: Lung apices clear Other: N/A IMPRESSION: Atrophy with small vessel chronic ischemic changes of deep cerebral white matter. No acute intracranial abnormalities. Scattered degenerative disc and facet disease changes of the cervical spine as above. No acute cervical spine abnormalities. Electronically Signed   By: Ulyses Southward M.D.   On: 04/03/2016 20:16   Dg Hip Unilat With Pelvis 2-3 Views Left  Result Date: 04/03/2016 CLINICAL DATA:  Left hip pain after fall. EXAM: DG HIP (WITH OR WITHOUT PELVIS) 2-3V LEFT COMPARISON:  None. FINDINGS: There is no evidence of hip fracture or dislocation. There is no evidence of arthropathy or other focal bone abnormality. IMPRESSION: Normal left hip. Electronically Signed   By: Lupita Raider, M.D.   On: 04/03/2016 20:31    EKG:   Orders placed or performed during the hospital encounter of 04/03/16  . EKG 12-Lead  . EKG 12-Lead  . EKG 12-Lead  . EKG 12-Lead  . EKG 12-lead  . EKG 12-Lead  . EKG 12-Lead      Management plans discussed with the patient, family and they are in agreement.  CODE STATUS:     Code Status Orders        Start     Ordered   04/04/16 0107  Do not attempt resuscitation (DNR)  Continuous    Question Answer Comment  In the event of cardiac or respiratory ARREST Do not call a "code blue"   In the event of cardiac or respiratory ARREST Do not perform Intubation, CPR, defibrillation or ACLS   In the event of cardiac or respiratory ARREST Use medication by any route, position, wound care, and other measures to relive pain and suffering. May use oxygen, suction and manual treatment of airway obstruction as needed for comfort.      04/04/16 0106    Code  Status History    Date Active Date Inactive Code Status Order ID Comments User Context   This patient has a current code status but no historical code status.    Advance Directive Documentation   Flowsheet Row Most Recent Value  Type of Advance Directive  Healthcare Power of Attorney  Pre-existing out of facility DNR order (yellow form or pink MOST form)  No data  "MOST" Form in Place?  No data      TOTAL TIME TAKING CARE OF THIS PATIENT: 40 minutes.  Discussed with patient's family and nursing staff  Shakiera Edelson M.D on 04/04/2016 at 12:50 PM  Between 7am to 6pm -  Pager - [phone]  After 6pm go to [email or web address] - password [email or web address] [facility]  [facility]  Office  [phone]  CC: Primary care physician; Jaclyn Shaggy, MD

## 2016-04-04 NOTE — Care Management Obs Status (Signed)
MEDICARE OBSERVATION STATUS NOTIFICATION   Patient Details  Name: Glenda AlexanderCarolyn L Pieratt MRN: [id number] Date of Birth: 1940/09/14   Medicare Observation Status Notification Given:  Yes    Marily MemosLisa M Roshini Fulwider, RN 04/04/2016, 12:14 PM

## 2016-04-05 ENCOUNTER — Telehealth: Payer: Self-pay

## 2016-04-05 LAB — HEMOGLOBIN A1C
HEMOGLOBIN A1C: 6.8 % — AB (ref 4.8–5.6)
MEAN PLASMA GLUCOSE: 148 mg/dL

## 2016-04-05 LAB — SODIUM: Sodium: 134 mmol/L — ABNORMAL LOW (ref 135–145)

## 2016-04-05 LAB — CK: CK TOTAL: 6768 U/L — AB (ref 38–234)

## 2016-04-05 LAB — GLUCOSE, CAPILLARY
Glucose-Capillary: 184 mg/dL — ABNORMAL HIGH (ref 65–99)
Glucose-Capillary: 276 mg/dL — ABNORMAL HIGH (ref 65–99)

## 2016-04-05 NOTE — Telephone Encounter (Signed)
Attempted to contact pt for TCM call.  Home # has been disconnected. Mobile # beeps repeatedly, does not ring.

## 2016-04-05 NOTE — [place] FL2 (Signed)
[place] MEDICAID FL2 LEVEL OF CARE SCREENING TOOL     IDENTIFICATION  Patient Name: Glenda Vaughn Birthdate: 07-27-1940 Sex: female Admission Date (Current Location): 04/03/2016  [place] Number:  Chiropodist and Address:  [facility], [address] [place], [place] [address]      Provider Number: [id number]  Attending Physician Name and Address:  Katharina Caper, MD  Relative Name and Phone Number:  Bennie Dallas Daughter   [phone]     Current Level of Care: Hospital Recommended Level of Care: Assisted Living Facility Prior Approval Number:    Date Approved/Denied:   PASRR Number:    Discharge Plan: Domiciliary (Rest home) [facility])    Current Diagnoses: Patient Active Problem List   Diagnosis Date Noted  . Elevated troponin 04/04/2016  . Fall 04/04/2016  . Alzheimer's dementia 04/04/2016  . Generalized weakness 04/04/2016  . Acute cystitis without hematuria 04/04/2016  . Iron deficiency anemia 04/04/2016  . Hyponatremia 04/04/2016  . Mild aortic stenosis 04/04/2016  . Urinary tract infection with hematuria     Orientation RESPIRATION BLADDER Height & Weight     Self  Normal Continent Weight: 122 lb 12.7 oz (55.7 kg) Height:  5\' 8"  (172.7 cm)  BEHAVIORAL SYMPTOMS/MOOD NEUROLOGICAL BOWEL NUTRITION STATUS      Continent Diet (Cardiac diet)  AMBULATORY STATUS COMMUNICATION OF NEEDS Skin   Supervision Verbally Normal                       Personal Care Assistance Level of Assistance  Bathing, Feeding, Dressing Bathing Assistance: Limited assistance Feeding assistance: Limited assistance Dressing Assistance: Limited assistance     Functional Limitations Info  Sight, Hearing, Speech Sight Info: Adequate Hearing Info: Adequate Speech Info: Adequate    SPECIAL CARE FACTORS FREQUENCY  PT (By licensed PT)     PT Frequency: Home Health 2x a week               Contractures  Contractures Info: Not present    Additional Factors Info  Allergies, Code Status, Insulin Sliding Scale, Psychotropic Code Status Info: DNR Allergies Info: nka Psychotropic Info: FLUoxetine (PROZAC) capsule 20 mg Insulin Sliding Scale Info: insulin aspart (novoLOG) injection 0-9 Units 3x a day with meals       Current Medications (04/05/2016):  This is the current hospital active medication list Current Facility-Administered Medications  Medication Dose Route Frequency Provider Last Rate Last Dose  . 0.9 % NaCl with KCl 20 mEq/ L  infusion   Intravenous Continuous Katharina Caper, MD 125 mL/hr at 04/05/16 0411 125 mL/hr at 04/05/16 0411  . diphenhydrAMINE (BENADRYL) capsule 25 mg  25 mg Oral QHS Alexis Hugelmeyer, DO   25 mg at 04/04/16 2120   And  . acetaminophen (TYLENOL) tablet 500 mg  500 mg Oral QHS Alexis Hugelmeyer, DO   500 mg at 04/04/16 2120  . acetaminophen (TYLENOL) tablet 650 mg  650 mg Oral Q4H PRN Alexis Hugelmeyer, DO      . acidophilus (RISAQUAD) capsule 1 capsule  1 capsule Oral BID Alexis Hugelmeyer, DO   1 capsule at 04/05/16 0823  . ALPRAZolam Prudy Feeler) tablet 0.25 mg  0.25 mg Oral BID PRN Alexis Hugelmeyer, DO      . aspirin chewable tablet 81 mg  81 mg Oral Daily Alexis Hugelmeyer, DO   81 mg at 04/05/16 0823  . atorvastatin (LIPITOR) tablet 20 mg  20 mg Oral q1800 [employer], DO  20 mg at 04/04/16 1722  . calcium-vitamin D (OSCAL WITH D) 500-200 MG-UNIT per tablet 1 tablet  1 tablet Oral Daily Alexis Hugelmeyer, DO   1 tablet at 04/05/16 0823  . cefTRIAXone (ROCEPHIN) IVPB 1 g  1 g Intravenous Q24H Katharina Caperima [place], MD   1 g at 04/04/16 1722  . donepezil (ARICEPT) tablet 10 mg  10 mg Oral Daily Alexis Hugelmeyer, DO   10 mg at 04/05/16 [id number]  . enoxaparin (LOVENOX) injection 40 mg  40 mg Subcutaneous Q24H Katharina Caperima [place], MD   40 mg at 04/04/16 2121  . FLUoxetine (PROZAC) capsule 20 mg  20 mg Oral Daily Alexis Hugelmeyer, DO   20 mg at 04/05/16 [id number]  . insulin  aspart (novoLOG) injection 0-5 Units  0-5 Units Subcutaneous QHS Alexis Hugelmeyer, DO   2 Units at 04/04/16 0226  . insulin aspart (novoLOG) injection 0-9 Units  0-9 Units Subcutaneous TID WC Alexis Hugelmeyer, DO   2 Units at 04/05/16 [id number]  . levothyroxine (SYNTHROID, LEVOTHROID) tablet 112 mcg  112 mcg Oral QAC breakfast Alexis Hugelmeyer, DO   112 mcg at 04/05/16 0823  . loperamide (IMODIUM) capsule 2 mg  2 mg Oral PRN Alexis Hugelmeyer, DO      . memantine (NAMENDA) tablet 10 mg  10 mg Oral BID Alexis Hugelmeyer, DO   10 mg at 04/05/16 [id number]  . metoprolol (LOPRESSOR) injection 5 mg  5 mg Intravenous Once [employer] Hugel[employer]yer, DO      . metoprolol tartrate (LOPRESSOR) tablet 12.5 mg  12.5 mg Oral BID Alexis Hugelmeyer, DO   12.5 mg at 04/05/16 0824  . multivitamin with minerals tablet 1 tablet  1 tablet Oral Daily Alexis Hugelmeyer, DO   1 tablet at 04/05/16 [id number]  . nitroGLYCERIN (NITROGLYN) 2 % ointment 0.5 inch  0.5 inch Topical Q8H Katharina Caperima [place], MD   0.5 inch at 04/05/16 0732  . nitroGLYCERIN (NITROSTAT) SL tablet 0.4 mg  0.4 mg Sublingual Q5 Min x 3 PRN Alexis Hugelmeyer, DO      . ondansetron (ZOFRAN) injection 4 mg  4 mg Intravenous Q6H PRN Alexis Hugelmeyer, DO      . vitamin C (ASCORBIC ACID) tablet 500 mg  500 mg Oral Daily Alexis Hugelmeyer, DO   500 mg at 04/05/16 [id number]     Discharge Medications: Please see discharge summary for a list of discharge medications.  Relevant Imaging Results:  Relevant Lab Results:   Additional Information SSN [id number]  Darleene Cleavernterhaus, Miryam Mcelhinney R

## 2016-04-05 NOTE — Clinical Social Work Note (Signed)
Clinical Social Work Assessment  Patient Details  Name: Glenda Vaughn MRN: [id number] Date of Birth: 05/16/1941  Date of referral:  04/05/16               Reason for consult:  Facility Placement                Permission sought to share information with:  Facility Medical sales representativeContact Representative, Family Supports Permission granted to share information::  Yes, Verbal Permission Granted  Name::     Glenda Vaughn,Glenda Vaughn   [phone]   [facility]  Relationship::     Contact Information:     Housing/Transportation Living arrangements for the past 2 months:  [facility] Source of Information:  Adult Children, Facility Patient Interpreter Needed:  None Criminal Activity/Legal Involvement Pertinent to Current Situation/Hospitalization:  No - Comment as needed Significant Relationships:  Adult Children Lives with:  Facility Resident Do you feel safe going back to the place where you live?  Yes Need for family participation in patient care:  Yes (Comment) (Patient has dementia)  Care giving concerns:  Patient's family does not have any concerns about her returning back to ALF, they would like her to receive some home health PT.   Social Worker assessment / plan:  Patient is a 75 year old female who [facility]  Patient is in the memory care unit, and is only alert to person.  MSW spoke to ALF and her Vaughn to complete assessment.  [facility], the family has been pleased with the care that was provided.  Patient's family was informed of the role of MSW and process for planning for discharge back to ALF.  Patient's family stated they were concerned about her since she had a fall at the ALF, they were requesting home health PT which was recommended by PT.  Patient's Vaughn expressed that she will plan on transporting patient to ALF once she is medically ready for discharge.  Patient's family and facility did not  express any concerns about her returning back to ALF.  Employment status:  Retired Database administratornsurance information:  Managed Medicare PT Recommendations:  Home with Home Health Information / Referral to community resources:     Patient/Family's Response to care:  Patient's family is in agreement to have patient return back to ALF.  Patient/Family's Understanding of and Emotional Response to Diagnosis, Current Treatment, and Prognosis: Patient has dementia and is not aware of diagnosis or treatment plan.  Patient's family would like her to receive some home health PT in order to have her return to ALF.  Emotional Assessment Appearance:  Appears stated age Attitude/Demeanor/Rapport:    Affect (typically observed):    Orientation:  Oriented to Self Alcohol / Substance use:  Not Applicable Psych involvement (Current and /or in the community):  No (Comment)  Discharge Needs  Concerns to be addressed:  No discharge needs identified Readmission within the last 30 days:  No Current discharge risk:  None Barriers to Discharge:  No Barriers Identified   Glenda Vaughn, [name] R 04/05/2016, 3:18 PM

## 2016-04-05 NOTE — Discharge Summary (Signed)
[facility] - [place] at [facility]   PATIENT NAME: Glenda Vaughn    MR#:  [id number]  DATE OF BIRTH:  Oct 02, 1940  DATE OF ADMISSION:  04/03/2016 ADMITTING PHYSICIAN: Jon Gills Hugelmeyer, DO  DATE OF DISCHARGE: No discharge date for patient encounter.  PRIMARY CARE PHYSICIAN: Jaclyn Shaggy, MD     ADMISSION DIAGNOSIS:  Fall [W19.XXXA] Elevated troponin [R74.8] Urinary tract infection with hematuria, site unspecified [N39.0, R31.9]  DISCHARGE DIAGNOSIS:  Principal Problem:   Fall Active Problems:   Generalized weakness   Acute cystitis without hematuria   Elevated troponin   Iron deficiency anemia   Hyponatremia   Alzheimer's dementia   Mild aortic stenosis   Urinary tract infection with hematuria   SECONDARY DIAGNOSIS:   Past Medical History:  Diagnosis Date  . Alzheimer's dementia   . Depression   . Diabetes mellitus without complication (HCC)   . Hyperlipidemia   . Hypertension   . Thyroid disease     .pro HOSPITAL COURSE:   The patient is 75 year old Caucasian female with past history significant for history of advanced dementia, depression, diabetes mellitus, hypertension, hyperlipidemia, thyroid disease, who presents to the hospital with complaints of fall. Patient was found to be awake and conscious, she had no complaints, however, because of dementia. She was not able to do total circumstances surrounding the fall. On arrival to the hospital, labs revealed hyponatremia, mild elevation of troponin to a maximum level of 0.99, hemoglobin level of 11.0. Iron studies revealed severe iron deficiency with iron saturation of 3, iron level of 11, ferritin level was 24, folate acid level was 21.6. B12 was 216. TSH was found to be high at 5.239, however, patient's free T4 was normal at 0.96, signifying sick euthyroid. Urinalysis revealed too numerous to count white blood cells, white blood cell clumps, positive for nitrites, 2+ leukocyte esterase.  Urine cultures were obtained, pending. The patient was admitted to the hospital, initiated on IV fluids, antibiotic with Rocephin. Her condition overall improved. Her sodium level improved to 134 from 132. She was seen by physical therapist and recommended home health services, which are going to be recommended for her upon discharge. She was also seen by cardiologist, who felt that patient's mild troponin elevation was likely demand ischemia. Echocardiogram was performed revealing mild aortic stenosis, normal ejection fraction, however, official result was not yet available. It was felt that patient is stable to be discharged back home with home health services and antibiotic therapy orally. It is recommended. However, to follow patient's urinary cultures to ensure that bacteria sensitive to Keflex. The patient's CK level was found to be markedly elevated at 18,400 on the day of admission, patient was given IV fluids and her CK level improved to 6700 on the day of discharge. Patient was advised to drink plenty of fluids and recheck her CK total was in 1-2 days after discharge.  Discussion by problem: #1. Fall, likely due to generalized weakness, related to urinary tract infection. Home health services will be prescribed to her upon discharge #2. Generalized weakness, likely due to her short infection, mild dehydration, home health physical therapy services will be arranged upon discharge as above #3. Elevated troponin, echocardiogram, per Mr. Shea Evans, cardiology PA, revealed normal ejection fraction, mild aortic stenosis, which needs to be followed as outpatient by cardiologist, no further workup was recommended #4 urinary tract infection, cultures are pending, patient is to continue antibiotic therapy with Keflex, it is recommended to follow patient's urinary culture results to  ensure that bacteria is sensitive to Keflex #5, Alzheimer's dementia, supportive therapy #6. Iron deficiency anemia, Hemoccult is  recommended as outpatient, and outpatient gastroenterology follow-up. If patient's family desires #7. Hyponatremia, likely mild dehydration, improved with IV fluid administration, it is recommended to follow patient's sodium level as outpatient and advance oral fluid intake if possible #8. Rhabdomyolysis, improved with IV fluid administration, patient was advised to drink plenty of fluids and follow her CK total levels as well as kidney function within 1-2 days after discharge. DISCHARGE CONDITIONS:   Stable  CONSULTS OBTAINED:    DRUG ALLERGIES:  No Known Allergies  DISCHARGE MEDICATIONS:   Current Discharge Medication List    START taking these medications   Details  cephALEXin (KEFLEX) 500 MG capsule Take 1 capsule (500 mg total) by mouth 3 (three) times daily. Qty: 15 capsule, Refills: 0      CONTINUE these medications which have NOT CHANGED   Details  aspirin 81 MG chewable tablet Chew 81 mg by mouth daily.    atorvastatin (LIPITOR) 20 MG tablet Take 20 mg by mouth daily at 6 PM.    Calcium Carbonate-Vitamin D3 (CALCIUM 600-D) 600-400 MG-UNIT TABS Take 1 tablet by mouth daily.    Diphenhydramine-APAP, sleep, (TYLENOL PM EXTRA STRENGTH PO) Take 1 tablet by mouth every evening.    donepezil (ARICEPT) 10 MG tablet Take 10 mg by mouth daily.    FLUoxetine (PROZAC) 20 MG capsule Take 20 mg by mouth daily.    glimepiride (AMARYL) 2 MG tablet Take 2 mg by mouth daily with breakfast.    levothyroxine (SYNTHROID, LEVOTHROID) 112 MCG tablet Take 112 mcg by mouth daily before breakfast.    loperamide (IMODIUM) 2 MG capsule Take 2 mg by mouth as needed for diarrhea or loose stools.    memantine (NAMENDA) 10 MG tablet Take 10 mg by mouth 2 (two) times daily.    metFORMIN (GLUCOPHAGE-XR) 500 MG 24 hr tablet Take 500 mg by mouth 2 (two) times daily.    Multiple Vitamin (MULTIVITAMIN) tablet Take 1 tablet by mouth daily.    naproxen sodium (ANAPROX) 220 MG tablet Take 220 mg by  mouth 2 (two) times daily with a meal.    Probiotic Product (ALIGN) 4 MG CAPS Take 4 mg by mouth daily.    vitamin C (ASCORBIC ACID) 500 MG tablet Take 500 mg by mouth daily.         DISCHARGE INSTRUCTIONS:    Patient is to follow-up with primary care physician as outpatient, she is recommended to follow up with cardiologist as outpatient as well  If you experience worsening of your admission symptoms, develop shortness of breath, life threatening emergency, suicidal or homicidal thoughts you must seek medical attention immediately by calling 911 or calling your MD immediately  if symptoms less severe.  You Must read complete instructions/literature along with all the possible adverse reactions/side effects for all the Medicines you take and that have been prescribed to you. Take any new Medicines after you have completely understood and accept all the possible adverse reactions/side effects.   Please note  You were cared for by a hospitalist during your hospital stay. If you have any questions about your discharge medications or the care you received while you were in the hospital after you are discharged, you can call the unit and asked to speak with the hospitalist on call if the hospitalist that took care of you is not available. Once you are discharged, your primary care physician will  handle any further medical issues. Please note that NO REFILLS for any discharge medications will be authorized once you are discharged, as it is imperative that you return to your primary care physician (or establish a relationship with a primary care physician if you do not have one) for your aftercare needs so that they can reassess your need for medications and monitor your lab values.    Today   CHIEF COMPLAINT:   Chief Complaint  Patient presents with  . Fall    HISTORY OF PRESENT ILLNESS:  Jodiann Ognibene  is a 75 y.o. female with a known history of advanced dementia, depression, diabetes  mellitus, hypertension, hyperlipidemia, thyroid disease, who presents to the hospital with complaints of fall. Patient was found to be awake and conscious, she had no complaints, however, because of dementia. She was not able to do total circumstances surrounding the fall. On arrival to the hospital, labs revealed hyponatremia, mild elevation of troponin to a maximum level of 0.99, hemoglobin level of 11.0. Iron studies revealed severe iron deficiency with iron saturation of 3, iron level of 11, ferritin level was 24, folate acid level was 21.6. B12 was 216. TSH was found to be high at 5.239, however, patient's free T4 was normal at 0.96, signifying sick euthyroid. Urinalysis revealed too numerous to count white blood cells, white blood cell clumps, positive for nitrites, 2+ leukocyte esterase. Urine cultures were obtained, pending. The patient was admitted to the hospital, initiated on IV fluids, antibiotic with Rocephin. Her condition overall improved. Her sodium level improved to 134 from 132. She was seen by physical therapist and recommended home health services, which are going to be recommended for her upon discharge. She was also seen by cardiologist, who felt that patient's mild troponin elevation was likely demand ischemia. Echocardiogram was performed revealing mild aortic stenosis, normal ejection fraction, however, official result was not yet available. It was felt that patient is stable to be discharged back home with home health services and antibiotic therapy orally. It is recommended. However, to follow patient's urinary cultures to ensure that bacteria sensitive to Keflex.The patient's CK level was found to be markedly elevated at 18,400 on the day of admission, patient was given IV fluids and her CK level improved to 6700 on the day of discharge. Patient was advised to drink plenty of fluids and recheck her CK total was in 1-2 days after discharge.   Discussion by problem: #1. Fall, likely due  to generalized weakness, related to urinary tract infection. Home health services will be prescribed to her upon discharge #2. Generalized weakness, likely due to her short infection, mild dehydration, home health physical therapy services will be arranged upon discharge as above #3. Elevated troponin, echocardiogram, per Mr. Shea Evans, cardiology PA, revealed normal ejection fraction, mild aortic stenosis, which needs to be followed as outpatient by cardiologist, no further workup was recommended #4 urinary tract infection, cultures are pending, patient is to continue antibiotic therapy with Keflex, it is recommended to follow patient's urinary culture results to ensure that bacteria is sensitive to Keflex #5, Alzheimer's dementia, supportive therapy #6. Iron deficiency anemia, Hemoccult is recommended as outpatient, and outpatient gastroenterology follow-up. If patient's family desires #7. Hyponatremia, likely mild dehydration, improved with IV fluid administration, it is recommended to follow patient's sodium level as outpatient and advance oral fluid intake if possible #8. Rhabdomyolysis, improved with IV fluid administration, patient was advised to drink plenty of fluids and follow her CK total levels as well as kidney  function within 1-2 days after discharge.    VITAL SIGNS:  Blood pressure 122/63, pulse 79, temperature 99 F (37.2 C), temperature source Oral, resp. rate 15, height 5\' 8"  (1.727 m), weight 55.7 kg (122 lb 12.7 oz), SpO2 100 %.  I/O:    Intake/Output Summary (Last 24 hours) at 04/05/16 1035 Last data filed at 04/04/16 2300  Gross per 24 hour  Intake              400 ml  Output              150 ml  Net              250 ml    PHYSICAL EXAMINATION:  GENERAL:  75 y.o.-year-old patient lying in the bed with no acute distress.  EYES: Pupils equal, round, reactive to light and accommodation. No scleral icterus. Extraocular muscles intact.  HEENT: Head atraumatic, normocephalic.  Oropharynx and nasopharynx clear.  NECK:  Supple, no jugular venous distention. No thyroid enlargement, no tenderness.  LUNGS: Normal breath sounds bilaterally, no wheezing, rales,rhonchi or crepitation. No use of accessory muscles of respiration.  CARDIOVASCULAR: S1, S2 normal. No murmurs, rubs, or gallops.  ABDOMEN: Soft, non-tender, non-distended. Bowel sounds present. No organomegaly or mass.  EXTREMITIES: No pedal edema, cyanosis, or clubbing.  NEUROLOGIC: Cranial nerves II through XII are intact. Muscle strength 5/5 in all extremities. Sensation intact. Gait not checked.  PSYCHIATRIC: The patient is alert and oriented x 3.  SKIN: No obvious rash, lesion, or ulcer.   DATA REVIEW:   CBC  Recent Labs Lab 04/04/16 0807  WBC 10.5  HGB 11.0*  HCT 32.4*  PLT 328    Chemistries   Recent Labs Lab 04/04/16 0116 04/04/16 0807 04/05/16 0506  NA  --  134* 134*  K  --  3.5  --   CL  --  99*  --   CO2  --  27  --   GLUCOSE  --  160*  --   BUN  --  9  --   CREATININE  --  0.59  --   CALCIUM  --  8.7*  --   MG 1.8  --   --     Cardiac Enzymes  Recent Labs Lab 04/04/16 1301  TROPONINI 0.72*    Microbiology Results  Results for orders placed or performed during the hospital encounter of 04/03/16  Urine culture     Status: Abnormal (Preliminary result)   Collection Time: 04/03/16  8:32 PM  Result Value Ref Range Status   Specimen Description URINE, RANDOM  Final   Special Requests NONE  Final   Culture >=100,000 COLONIES/mL GRAM NEGATIVE RODS (A)  Final   Report Status PENDING  Incomplete  MRSA PCR Screening     Status: None   Collection Time: 04/04/16  1:18 AM  Result Value Ref Range Status   MRSA by PCR NEGATIVE NEGATIVE Final    Comment:        The GeneXpert MRSA Assay (FDA approved for NASAL specimens only), is one component of a comprehensive MRSA colonization surveillance program. It is not intended to diagnose MRSA infection nor to guide or monitor  treatment for MRSA infections.     RADIOLOGY:  Ct Head Wo Contrast  Result Date: 04/03/2016 CLINICAL DATA:  Unwitnessed fall, found on floor and bathroom, cool and pale when found, history Alzheimer's, diabetes mellitus, hypertension EXAM: CT HEAD WITHOUT CONTRAST CT CERVICAL SPINE WITHOUT CONTRAST TECHNIQUE: Multidetector CT imaging  of the head and cervical spine was performed following the standard protocol without intravenous contrast. Multiplanar CT image reconstructions of the cervical spine were also generated. Motion artifacts on initial CT at imaging, for which repeat imaging was performed COMPARISON:  None; correlation MR brain 12/29/2008 FINDINGS: CT HEAD FINDINGS Brain: Generalized atrophy. Normal ventricular morphology. No midline shift or mass effect. Small vessel chronic ischemic changes of deep cerebral white matter. No intracranial hemorrhage, mass lesion, evidence of acute infarction, or extra-axial fluid collection. Vascular: Atherosclerotic calcifications at the carotid siphons Skull: Intact Sinuses/Orbits: Clear Other: N/A CT CERVICAL SPINE FINDINGS Alignment: Minimal retrolisthesis at C5-C6. Alignment otherwise normal Skull base and vertebrae: Skullbase intact. Vertebral body heights maintained. No fracture or bone destruction. Scattered facet degenerative changes. Soft tissues and spinal canal: Prevertebral soft tissues normal thickness. Atherosclerotic calcifications in the carotid systems bilaterally. Disc levels: Disc space narrowing with endplate spur formation at C5-C6 and C6-C7. Uncovertebral spurs LEFT C5-C6 and C6-C7 foramina. Upper chest: Lung apices clear Other: N/A IMPRESSION: Atrophy with small vessel chronic ischemic changes of deep cerebral white matter. No acute intracranial abnormalities. Scattered degenerative disc and facet disease changes of the cervical spine as above. No acute cervical spine abnormalities. Electronically Signed   By: Ulyses SouthwardMark  Boles M.D.   On:  04/03/2016 20:16   Ct Cervical Spine Wo Contrast  Result Date: 04/03/2016 CLINICAL DATA:  Unwitnessed fall, found on floor and bathroom, cool and pale when found, history Alzheimer's, diabetes mellitus, hypertension EXAM: CT HEAD WITHOUT CONTRAST CT CERVICAL SPINE WITHOUT CONTRAST TECHNIQUE: Multidetector CT imaging of the head and cervical spine was performed following the standard protocol without intravenous contrast. Multiplanar CT image reconstructions of the cervical spine were also generated. Motion artifacts on initial CT at imaging, for which repeat imaging was performed COMPARISON:  None; correlation MR brain 12/29/2008 FINDINGS: CT HEAD FINDINGS Brain: Generalized atrophy. Normal ventricular morphology. No midline shift or mass effect. Small vessel chronic ischemic changes of deep cerebral white matter. No intracranial hemorrhage, mass lesion, evidence of acute infarction, or extra-axial fluid collection. Vascular: Atherosclerotic calcifications at the carotid siphons Skull: Intact Sinuses/Orbits: Clear Other: N/A CT CERVICAL SPINE FINDINGS Alignment: Minimal retrolisthesis at C5-C6. Alignment otherwise normal Skull base and vertebrae: Skullbase intact. Vertebral body heights maintained. No fracture or bone destruction. Scattered facet degenerative changes. Soft tissues and spinal canal: Prevertebral soft tissues normal thickness. Atherosclerotic calcifications in the carotid systems bilaterally. Disc levels: Disc space narrowing with endplate spur formation at C5-C6 and C6-C7. Uncovertebral spurs LEFT C5-C6 and C6-C7 foramina. Upper chest: Lung apices clear Other: N/A IMPRESSION: Atrophy with small vessel chronic ischemic changes of deep cerebral white matter. No acute intracranial abnormalities. Scattered degenerative disc and facet disease changes of the cervical spine as above. No acute cervical spine abnormalities. Electronically Signed   By: Ulyses SouthwardMark  Boles M.D.   On: 04/03/2016 20:16   [place]  Carotid Bilateral  Result Date: 04/04/2016 CLINICAL DATA:  Asymptomatic bruit, laterality not specified. Hypertension, hyperlipidemia, diabetes. EXAM: BILATERAL CAROTID DUPLEX ULTRASOUND TECHNIQUE: Wallace CullensGray scale imaging, color Doppler and duplex ultrasound was performed of bilateral carotid and vertebral arteries in the neck. COMPARISON:  None. TECHNIQUE: Quantification of carotid stenosis is based on velocity parameters that correlate the residual internal carotid diameter with NASCET-based stenosis levels, using the diameter of the distal internal carotid lumen as the denominator for stenosis measurement. The following velocity measurements were obtained: PEAK SYSTOLIC/END DIASTOLIC RIGHT ICA:  111/12cm/sec CCA:                     84/16cm/sec SYSTOLIC ICA/CCA RATIO:  1.3 DIASTOLIC ICA/CCA RATIO:  0.7 ECA:                     123cm/sec LEFT ICA:                     91/12cm/sec CCA:                     79/12cm/sec SYSTOLIC ICA/CCA RATIO:  1.2 DIASTOLIC ICA/CCA RATIO: 1.0 ECA:                     90cm/sec FINDINGS: RIGHT CAROTID ARTERY: Eccentric partially calcified plaque partially effaces the carotid bulb and extends into the proximal external and internal carotid arteries resulting in at least mild stenosis. Normal waveforms and color Doppler signal. RIGHT VERTEBRAL ARTERY:  Normal flow direction and waveform. LEFT CAROTID ARTERY: Eccentric partially calcified plaque in the bulb and proximal ICA. No high-grade stenosis. Normal waveforms and color Doppler signal. LEFT VERTEBRAL ARTERY: Normal flow direction and waveform. IMPRESSION: 1. Bilateral carotid bifurcation and proximal ICA plaque, right greater than left, resulting in less than 50% diameter stenosis. The exam does not exclude plaque ulceration or embolization. Continued surveillance recommended. 2.  Antegrade bilateral vertebral arterial flow. Electronically Signed   By: Corlis Leak M.D.   On: 04/04/2016 13:07   Dg Hip Unilat With  Pelvis 2-3 Views Left  Result Date: 04/03/2016 CLINICAL DATA:  Left hip pain after fall. EXAM: DG HIP (WITH OR WITHOUT PELVIS) 2-3V LEFT COMPARISON:  None. FINDINGS: There is no evidence of hip fracture or dislocation. There is no evidence of arthropathy or other focal bone abnormality. IMPRESSION: Normal left hip. Electronically Signed   By: Lupita Raider, M.D.   On: 04/03/2016 20:31    EKG:   Orders placed or performed during the hospital encounter of 04/03/16  . EKG 12-Lead  . EKG 12-Lead  . EKG 12-Lead  . EKG 12-Lead  . EKG 12-lead  . EKG 12-Lead  . EKG 12-Lead  . EKG 12-Lead      Management plans discussed with the patient, family and they are in agreement.  CODE STATUS:     Code Status Orders        Start     Ordered   04/04/16 0107  Do not attempt resuscitation (DNR)  Continuous    Question Answer Comment  In the event of cardiac or respiratory ARREST Do not call a "code blue"   In the event of cardiac or respiratory ARREST Do not perform Intubation, CPR, defibrillation or ACLS   In the event of cardiac or respiratory ARREST Use medication by any route, position, wound care, and other measures to relive pain and suffering. May use oxygen, suction and manual treatment of airway obstruction as needed for comfort.      04/04/16 0106    Code Status History    Date Active Date Inactive Code Status Order ID Comments User Context   This patient has a current code status but no historical code status.    Advance Directive Documentation   Flowsheet Row Most Recent Value  Type of Advance Directive  Healthcare Power of Attorney  Pre-existing out of facility DNR order (yellow form or pink MOST form)  No data  "MOST" Form in Place?  No data  TOTAL TIME TAKING CARE OF THIS PATIENT: 40 minutes.  Discussed with patient's family and nursing staff  Milbert Bixler M.D on 04/05/2016 at 10:35 AM  Between 7am to 6pm - Pager - [phone]  After 6pm go to [email or web address]  - password [email or web address] [facility]  [facility]  Office  [phone]  CC: Primary care physician; Jaclyn Shaggy, MD

## 2016-04-05 NOTE — Clinical Social Work Note (Signed)
[facility]  Patient and family agreeable to plans will transport via daughter's car.  Windell MouldingEric Andras Grunewald, MSW Mon-Fri 8a-4:30p [phone]

## 2016-04-05 NOTE — Progress Notes (Signed)
Removed telemetry and PIV.  Family member to transport [facility]

## 2016-04-05 NOTE — Telephone Encounter (Signed)
-----   Message from Coralee RudSabrina F Gilley sent at 04/05/2016 11:57 AM EDT ----- Regarding: tcm/ph 10/31 2:15 Dr. Okey DupreEnd

## 2016-04-06 LAB — URINE CULTURE: Culture: >=100000 — AB

## 2016-04-17 ENCOUNTER — Encounter: Payer: Self-pay | Admitting: *Deleted

## 2016-04-17 ENCOUNTER — Encounter: Payer: Medicare Other | Admitting: Internal Medicine

## 2016-08-31 ENCOUNTER — Ambulatory Visit: Payer: Self-pay | Admitting: Podiatry

## 2017-04-11 ENCOUNTER — Encounter: Payer: Self-pay | Admitting: Emergency Medicine

## 2017-04-11 ENCOUNTER — Emergency Department: Payer: Medicare Other

## 2017-04-11 ENCOUNTER — Observation Stay
Admission: EM | Admit: 2017-04-11 | Discharge: 2017-04-12 | Disposition: A | Discharge status: Skilled Nursing Facility | Payer: Medicare Other | Attending: Internal Medicine | Admitting: Internal Medicine

## 2017-04-11 DIAGNOSIS — E860 Dehydration: Secondary | ICD-10-CM | POA: Diagnosis not present

## 2017-04-11 DIAGNOSIS — I350 Nonrheumatic aortic (valve) stenosis: Secondary | ICD-10-CM | POA: Diagnosis not present

## 2017-04-11 DIAGNOSIS — Z79899 Other long term (current) drug therapy: Secondary | ICD-10-CM | POA: Diagnosis not present

## 2017-04-11 DIAGNOSIS — A419 Sepsis, unspecified organism: Principal | ICD-10-CM | POA: Insufficient documentation

## 2017-04-11 DIAGNOSIS — E119 Type 2 diabetes mellitus without complications: Secondary | ICD-10-CM | POA: Insufficient documentation

## 2017-04-11 DIAGNOSIS — N12 Tubulo-interstitial nephritis, not specified as acute or chronic: Secondary | ICD-10-CM

## 2017-04-11 DIAGNOSIS — Z23 Encounter for immunization: Secondary | ICD-10-CM | POA: Insufficient documentation

## 2017-04-11 DIAGNOSIS — N390 Urinary tract infection, site not specified: Secondary | ICD-10-CM | POA: Insufficient documentation

## 2017-04-11 DIAGNOSIS — F0280 Dementia in other diseases classified elsewhere without behavioral disturbance: Secondary | ICD-10-CM | POA: Insufficient documentation

## 2017-04-11 DIAGNOSIS — Z791 Long term (current) use of non-steroidal anti-inflammatories (NSAID): Secondary | ICD-10-CM | POA: Diagnosis not present

## 2017-04-11 DIAGNOSIS — Z7982 Long term (current) use of aspirin: Secondary | ICD-10-CM | POA: Insufficient documentation

## 2017-04-11 DIAGNOSIS — E871 Hypo-osmolality and hyponatremia: Secondary | ICD-10-CM | POA: Insufficient documentation

## 2017-04-11 DIAGNOSIS — Z66 Do not resuscitate: Secondary | ICD-10-CM | POA: Diagnosis not present

## 2017-04-11 DIAGNOSIS — E785 Hyperlipidemia, unspecified: Secondary | ICD-10-CM | POA: Diagnosis not present

## 2017-04-11 DIAGNOSIS — E079 Disorder of thyroid, unspecified: Secondary | ICD-10-CM | POA: Diagnosis not present

## 2017-04-11 DIAGNOSIS — Z7984 Long term (current) use of oral hypoglycemic drugs: Secondary | ICD-10-CM | POA: Insufficient documentation

## 2017-04-11 DIAGNOSIS — F329 Major depressive disorder, single episode, unspecified: Secondary | ICD-10-CM | POA: Insufficient documentation

## 2017-04-11 DIAGNOSIS — I10 Essential (primary) hypertension: Secondary | ICD-10-CM | POA: Insufficient documentation

## 2017-04-11 DIAGNOSIS — N3000 Acute cystitis without hematuria: Secondary | ICD-10-CM | POA: Diagnosis present

## 2017-04-11 DIAGNOSIS — I700 Atherosclerosis of aorta: Secondary | ICD-10-CM | POA: Insufficient documentation

## 2017-04-11 DIAGNOSIS — G309 Alzheimer's disease, unspecified: Secondary | ICD-10-CM | POA: Insufficient documentation

## 2017-04-11 DIAGNOSIS — R4182 Altered mental status, unspecified: Secondary | ICD-10-CM | POA: Diagnosis present

## 2017-04-11 LAB — URINALYSIS, COMPLETE (UACMP) WITH MICROSCOPIC
Bilirubin Urine: NEGATIVE
KETONES UR: 5 mg/dL — AB
NITRITE: POSITIVE — AB
PROTEIN: 100 mg/dL — AB
SQUAMOUS EPITHELIAL / LPF: NONE SEEN
Specific Gravity, Urine: 1.013 (ref 1.005–1.030)
pH: 5 (ref 5.0–8.0)

## 2017-04-11 LAB — COMPREHENSIVE METABOLIC PANEL
ALK PHOS: 97 U/L (ref 38–126)
ALT: 10 U/L — AB (ref 14–54)
AST: 16 U/L (ref 15–41)
Albumin: 3.3 g/dL — ABNORMAL LOW (ref 3.5–5.0)
Anion gap: 11 (ref 5–15)
BUN: 19 mg/dL (ref 6–20)
CALCIUM: 9.1 mg/dL (ref 8.9–10.3)
CO2: 22 mmol/L (ref 22–32)
Chloride: 97 mmol/L — ABNORMAL LOW (ref 101–111)
Glucose, Bld: 141 mg/dL — ABNORMAL HIGH (ref 65–99)
Potassium: 4 mmol/L (ref 3.5–5.1)
SODIUM: 130 mmol/L — AB (ref 135–145)
Total Bilirubin: 0.6 mg/dL (ref 0.3–1.2)
Total Protein: 6.6 g/dL (ref 6.5–8.1)

## 2017-04-11 LAB — CBC WITH DIFFERENTIAL/PLATELET
Basophils Absolute: 0.1 10*3/uL (ref 0–0.1)
Basophils Relative: 0 %
EOS ABS: 0 10*3/uL (ref 0–0.7)
EOS PCT: 0 %
HCT: 31.4 % — ABNORMAL LOW (ref 35.0–47.0)
Hemoglobin: 10.4 g/dL — ABNORMAL LOW (ref 12.0–16.0)
Lymphocytes Relative: 11 %
Lymphs Abs: 1.6 10*3/uL (ref 1.0–3.6)
MCH: 27.4 pg (ref 26.0–34.0)
MCHC: 33.2 g/dL (ref 32.0–36.0)
MCV: 82.5 fL (ref 80.0–100.0)
MONO ABS: 1.4 10*3/uL — AB (ref 0.2–0.9)
MONOS PCT: 9 %
Neutro Abs: 11.8 10*3/uL — ABNORMAL HIGH (ref 1.4–6.5)
Neutrophils Relative %: 80 %
PLATELETS: 363 10*3/uL (ref 150–440)
RBC: 3.8 MIL/uL (ref 3.80–5.20)
RDW: 16.1 % — AB (ref 11.5–14.5)
WBC: 14.8 10*3/uL — ABNORMAL HIGH (ref 3.6–11.0)

## 2017-04-11 LAB — PROTIME-INR
INR: 0.98
PROTHROMBIN TIME: 12.9 s (ref 11.4–15.2)

## 2017-04-11 LAB — GLUCOSE, CAPILLARY: Glucose-Capillary: 266 mg/dL — ABNORMAL HIGH (ref 65–99)

## 2017-04-11 LAB — LACTIC ACID, PLASMA: Lactic Acid, Venous: 1.3 mmol/L (ref 0.5–1.9)

## 2017-04-11 MED ORDER — MEMANTINE HCL 5 MG PO TABS
10.0000 mg | ORAL_TABLET | Freq: Two times a day (BID) | ORAL | Status: DC
  Administered 2017-04-11 – 2017-04-12 (×2): 10 mg via ORAL
  Filled 2017-04-11 (×2): qty 2

## 2017-04-11 MED ORDER — ALIGN 4 MG PO CAPS: 4.0000 mg | ORAL_CAPSULE | Freq: Every day | ORAL | Status: DC

## 2017-04-11 MED ORDER — ACETAMINOPHEN 325 MG PO TABS
ORAL_TABLET | ORAL | Status: AC
  Administered 2017-04-11: 650 mg via ORAL
  Filled 2017-04-11: qty 2

## 2017-04-11 MED ORDER — INSULIN ASPART 100 UNIT/ML ~~LOC~~ SOLN
0.0000 [IU] | Freq: Three times a day (TID) | SUBCUTANEOUS | Status: DC
  Administered 2017-04-12 (×2): 2 [IU] via SUBCUTANEOUS
  Filled 2017-04-11 (×2): qty 1

## 2017-04-11 MED ORDER — FLUOXETINE HCL 20 MG PO CAPS
20.0000 mg | ORAL_CAPSULE | Freq: Every day | ORAL | Status: DC
  Administered 2017-04-12: 20 mg via ORAL
  Filled 2017-04-11: qty 1

## 2017-04-11 MED ORDER — SODIUM CHLORIDE 0.9 % IV BOLUS (SEPSIS)
1000.0000 mL | Freq: Once | INTRAVENOUS | Status: AC
  Administered 2017-04-11: 1000 mL via INTRAVENOUS

## 2017-04-11 MED ORDER — CEFTRIAXONE SODIUM IN DEXTROSE 20 MG/ML IV SOLN
1.0000 g | Freq: Once | INTRAVENOUS | Status: AC
  Administered 2017-04-11: 1 g via INTRAVENOUS
  Filled 2017-04-11: qty 50

## 2017-04-11 MED ORDER — DOCUSATE SODIUM 100 MG PO CAPS: 100.0000 mg | ORAL_CAPSULE | Freq: Two times a day (BID) | ORAL | Status: DC | PRN

## 2017-04-11 MED ORDER — PNEUMOCOCCAL VAC POLYVALENT 25 MCG/0.5ML IJ INJ
0.5000 mL | INJECTION | INTRAMUSCULAR | Status: AC
  Administered 2017-04-12: 0.5 mL via INTRAMUSCULAR
  Filled 2017-04-11: qty 0.5

## 2017-04-11 MED ORDER — VITAMIN C 500 MG PO TABS
500.0000 mg | ORAL_TABLET | Freq: Every day | ORAL | Status: DC
  Administered 2017-04-12: 500 mg via ORAL
  Filled 2017-04-11: qty 1

## 2017-04-11 MED ORDER — DONEPEZIL HCL 10 MG PO TABS
10.0000 mg | ORAL_TABLET | Freq: Every day | ORAL | Status: DC
  Administered 2017-04-12: 10 mg via ORAL
  Filled 2017-04-11: qty 1

## 2017-04-11 MED ORDER — ATORVASTATIN CALCIUM 20 MG PO TABS: 20.0000 mg | ORAL_TABLET | Freq: Every day | ORAL | Status: DC

## 2017-04-11 MED ORDER — LEVOTHYROXINE SODIUM 112 MCG PO TABS
112.0000 ug | ORAL_TABLET | Freq: Every day | ORAL | Status: DC
  Administered 2017-04-12: 112 ug via ORAL
  Filled 2017-04-11: qty 1

## 2017-04-11 MED ORDER — METFORMIN HCL ER 500 MG PO TB24: 500.0000 mg | ORAL_TABLET | Freq: Two times a day (BID) | ORAL | Status: DC

## 2017-04-11 MED ORDER — LORAZEPAM 0.5 MG PO TABS
0.5000 mg | ORAL_TABLET | Freq: Once | ORAL | Status: AC
  Administered 2017-04-11: 0.5 mg via ORAL
  Filled 2017-04-11: qty 1

## 2017-04-11 MED ORDER — CALCIUM CARBONATE-VITAMIN D3 600-400 MG-UNIT PO TABS: 1.0000 | ORAL_TABLET | Freq: Every day | ORAL | Status: DC

## 2017-04-11 MED ORDER — ACETAMINOPHEN 325 MG PO TABS: 650.0000 mg | ORAL_TABLET | Freq: Four times a day (QID) | ORAL | Status: DC | PRN

## 2017-04-11 MED ORDER — NAPROXEN SODIUM 220 MG PO TABS: 220.0000 mg | ORAL_TABLET | Freq: Two times a day (BID) | ORAL | Status: DC

## 2017-04-11 MED ORDER — ASPIRIN 81 MG PO CHEW
81.0000 mg | CHEWABLE_TABLET | Freq: Every day | ORAL | Status: DC
Start: 2017-04-12 — End: 2017-04-12
  Administered 2017-04-12: 81 mg via ORAL
  Filled 2017-04-11: qty 1

## 2017-04-11 MED ORDER — DIPHENHYDRAMINE-APAP (SLEEP) 25-500 MG PO TABS: 1.0000 | ORAL_TABLET | Freq: Every evening | ORAL | Status: DC | PRN

## 2017-04-11 MED ORDER — CEFTRIAXONE SODIUM IN DEXTROSE 20 MG/ML IV SOLN
1.0000 g | INTRAVENOUS | Status: DC
  Filled 2017-04-11: qty 50

## 2017-04-11 MED ORDER — ACETAMINOPHEN 325 MG PO TABS
650.0000 mg | ORAL_TABLET | Freq: Once | ORAL | Status: AC | PRN
  Administered 2017-04-11: 650 mg via ORAL

## 2017-04-11 MED ORDER — HEPARIN SODIUM (PORCINE) 5000 UNIT/ML IJ SOLN
5000.0000 [IU] | Freq: Three times a day (TID) | INTRAMUSCULAR | Status: DC
  Administered 2017-04-12: 5000 [IU] via SUBCUTANEOUS
  Filled 2017-04-11: qty 1

## 2017-04-11 NOTE — ED Notes (Signed)
Patient helped us to use restroom.

## 2017-04-11 NOTE — ED Triage Notes (Signed)
Pt comes into the ED via POV c/o altered mental status, increased urination, and fever.  [facility]  Family brought her here for evaluation.  Patient in NAD at this time with even and unlabored respirations, but is tachycardic and febrile.  Sepsis protocols ordered and MD notified.

## 2017-04-11 NOTE — ED Notes (Signed)
Patient helped up to the bathroom.  

## 2017-04-11 NOTE — ED Notes (Signed)
Patient helped up to restroom

## 2017-04-11 NOTE — ED Notes (Signed)
Patient helped to restroom

## 2017-04-11 NOTE — ED Provider Notes (Signed)
[facility] Emergency Department Provider Note  ____________________________________________  Time seen: Approximately 7:43 PM  I have reviewed the triage vital signs and the nursing notes.   HISTORY  Chief Complaint Altered Mental Status  Level 5 caveat:  Portions of the history and physical were unable to be obtained due to dementia   HPI Glenda Vaughn is a 76 y.o. female with a history of Alzheimer's, diabetes, hypertension, hyperlipidemia who presents for evaluation of confusion and fever. According to patient's daughter she was doing well at her daycare until 2PM this afternoon. At that time she started to become more confused and weak and was found to have a fever. Daughter checked her BG which was 300s.  Her confusion and weakness got progressively worse which prompted visit to the ED. Daughter reports the patient had similar episode a year ago in the setting of a UTI. Patient has had no cough, congestion, sore throat, chest pain, shortness of breath, abdominal pain, nausea, vomiting, diarrhea. Patient has no complaints at this time.  Past Medical History:  Diagnosis Date  . Alzheimer's dementia   . Depression   . Diabetes mellitus without complication (HCC)   . Hyperlipidemia   . Hypertension   . Thyroid disease     Patient Active Problem List   Diagnosis Date Noted  . Elevated troponin 04/04/2016  . Fall 04/04/2016  . Alzheimer's dementia 04/04/2016  . Generalized weakness 04/04/2016  . Acute cystitis without hematuria 04/04/2016  . Iron deficiency anemia 04/04/2016  . Hyponatremia 04/04/2016  . Mild aortic stenosis 04/04/2016  . Urinary tract infection with hematuria     History reviewed. No pertinent surgical history.  Prior to Admission medications   Medication Sig Start Date End Date Taking? Authorizing Provider  aspirin 81 MG chewable tablet Chew 81 mg by mouth daily.    [provider]  atorvastatin (LIPITOR) 20 MG  tablet Take 20 mg by mouth daily at 6 PM.    [provider]  Calcium Carbonate-Vitamin D3 (CALCIUM 600-D) 600-400 MG-UNIT TABS Take 1 tablet by mouth daily.    [provider]  cephALEXin (KEFLEX) 500 MG capsule Take 1 capsule (500 mg total) by mouth 3 (three) times daily. 04/04/16   Katharina Caper, MD  Diphenhydramine-APAP, sleep, (TYLENOL PM EXTRA STRENGTH PO) Take 1 tablet by mouth every evening.    [provider]  donepezil (ARICEPT) 10 MG tablet Take 10 mg by mouth daily.    [provider]  FLUoxetine (PROZAC) 20 MG capsule Take 20 mg by mouth daily.    [provider]  glimepiride (AMARYL) 2 MG tablet Take 2 mg by mouth daily with breakfast.    [provider]  levothyroxine (SYNTHROID, LEVOTHROID) 112 MCG tablet Take 112 mcg by mouth daily before breakfast.    [provider]  loperamide (IMODIUM) 2 MG capsule Take 2 mg by mouth as needed for diarrhea or loose stools.    [provider]  memantine (NAMENDA) 10 MG tablet Take 10 mg by mouth 2 (two) times daily.    [provider]  metFORMIN (GLUCOPHAGE-XR) 500 MG 24 hr tablet Take 500 mg by mouth 2 (two) times daily.    [provider]  Multiple Vitamin (MULTIVITAMIN) tablet Take 1 tablet by mouth daily.    [provider]  naproxen sodium (ANAPROX) 220 MG tablet Take 220 mg by mouth 2 (two) times daily with a meal.    [provider]  Probiotic Product (ALIGN)  4 MG CAPS Take 4 mg by mouth daily.    [provider]  vitamin C (ASCORBIC ACID) 500 MG tablet Take 500 mg by mouth daily.    [provider]    Allergies Patient has no known allergies.  Family History  Problem Relation Age of Onset  . Family history unknown: Yes    Social History Social History  Substance Use Topics  . Smoking status: Never Smoker  . Smokeless tobacco: Never Used  . Alcohol use No    Review of Systems  Constitutional: +  fever, AMS and weakness ENT: Negative for sore throat. Neck: No neck pain  Cardiovascular: Negative for chest pain. Respiratory: Negative for shortness of breath. Gastrointestinal: Negative for abdominal pain, vomiting or diarrhea. Genitourinary: Negative for dysuria. Musculoskeletal: Negative for back pain. Skin: Negative for rash. Neurological: Negative for headaches, weakness or numbness. Psych: No SI or HI  ____________________________________________   PHYSICAL EXAM:  VITAL SIGNS: ED Triage Vitals  Enc Vitals Group     BP 04/11/17 1845 (!) 178/86     Pulse Rate 04/11/17 1845 (!) 108     Resp 04/11/17 1845 18     Temp 04/11/17 1845 (!) 102.9 F (39.4 C)     Temp Source 04/11/17 1845 Oral     SpO2 04/11/17 1845 97 %     Weight 04/11/17 1846 145 lb (65.8 kg)     Height 04/11/17 1846 5\' 6"  (1.676 m)     Head Circumference --      Peak Flow --      Pain Score --      Pain Loc --      Pain Edu? --      Excl. in GC? --     Constitutional: Alert and oriented x 1. Well appearing and in no apparent distress. HEENT:      Head: Normocephalic and atraumatic.         Eyes: Conjunctivae are normal. Sclera is non-icteric.       Mouth/Throat: Mucous membranes are moist.       Neck: Supple with no signs of meningismus. Cardiovascular: tachycardic with regular rhythm. No murmurs, gallops, or rubs. 2+ symmetrical distal pulses are present in all extremities. No JVD. Respiratory: Normal respiratory effort. Lungs are clear to auscultation bilaterally with crackles on the R base.  Gastrointestinal: Soft, non tender, and non distended with positive bowel sounds. No rebound or guarding. Musculoskeletal: Nontender with normal range of motion in all extremities. No edema, cyanosis, or erythema of extremities. Neurologic: Normal speech and language. Face is symmetric. Moving all extremities. No gross focal neurologic deficits are appreciated. Skin: Skin is warm, dry and intact. No rash  noted. Psychiatric: Mood and affect are normal. Speech and behavior are normal.  ____________________________________________   LABS (all labs ordered are listed, but only abnormal results are displayed)  Labs Reviewed  COMPREHENSIVE METABOLIC PANEL - Abnormal; Notable for the following:       Result Value   Sodium 130 (*)    Chloride 97 (*)    Glucose, Bld 141 (*)    Creatinine, Ser <0.30 (*)    Albumin 3.3 (*)    ALT 10 (*)    All other components within normal limits  CBC WITH DIFFERENTIAL/PLATELET - Abnormal; Notable for the following:    WBC 14.8 (*)    Hemoglobin 10.4 (*)    HCT 31.4 (*)    RDW 16.1 (*)    Neutro Abs 11.8 (*)  Monocytes Absolute 1.4 (*)    All other components within normal limits  URINALYSIS, COMPLETE (UACMP) WITH MICROSCOPIC - Abnormal; Notable for the following:    Color, Urine YELLOW (*)    APPearance TURBID (*)    Glucose, UA >=500 (*)    Hgb urine dipstick MODERATE (*)    Ketones, ur 5 (*)    Protein, ur 100 (*)    Nitrite POSITIVE (*)    Leukocytes, UA MODERATE (*)    Bacteria, UA MANY (*)    All other components within normal limits  CULTURE, BLOOD (ROUTINE X 2)  CULTURE, BLOOD (ROUTINE X 2)  URINE CULTURE  LACTIC ACID, PLASMA  PROTIME-INR  LACTIC ACID, PLASMA   ____________________________________________  EKG  ED ECG REPORT I, Nita Sickle, the attending physician, personally viewed and interpreted this ECG.  Sinus tachycardia, rate of 107, normal intervals, normal axis, no ST elevations or depressions.  ____________________________________________  RADIOLOGY  CXR: negative  ____________________________________________   PROCEDURES  Procedure(s) performed: None Procedures Critical Care performed: yes  CRITICAL CARE Performed by: Nita Sickle  ?  Total critical care time: 35 min  Critical care time was exclusive of separately billable procedures and treating other patients.  Critical care was  necessary to treat or prevent imminent or life-threatening deterioration.  Critical care was time spent personally by me on the following activities: development of treatment plan with patient and/or surrogate as well as nursing, discussions with consultants, evaluation of patient's response to treatment, examination of patient, obtaining history from patient or surrogate, ordering and performing treatments and interventions, ordering and review of laboratory studies, ordering and review of radiographic studies, pulse oximetry and re-evaluation of patient's condition.  ____________________________________________   INITIAL IMPRESSION / ASSESSMENT AND PLAN / ED COURSE  76 y.o. female with a history of Alzheimer's, diabetes, hypertension, hyperlipidemia who presents for evaluation of confusion and feversince 2 PM this afternoon. Upon arrival to the emergency department patient meets sepsis criteria with a fever of 102.9 and tachycardia with heart rate of 108. Patient was started on sepsis protocol with IV fluids and Rocephin for possible UTI. labs show a slight hyponatremia, normal creatinine, normal lactic acid, leukocytosis with white count of 14.8 and a urinalysis concerning for UTI.heart rate improved after Tylenol and fluids. Patient can be admitted to the hospitalist for urosepsis.      As part of my medical decision making, I reviewed the following data within the electronic MEDICAL RECORD NUMBER History obtained from family, Nursing notes reviewed and incorporated, Labs reviewed , EKG interpreted , Old chart reviewed, Radiograph reviewed , Discussed with admitting physician , Notes from prior ED visits and [place] Controlled Substance Database    Pertinent labs & imaging results that were available during my care of the patient were reviewed by me and considered in my medical decision making (see chart for details).    ____________________________________________   FINAL CLINICAL IMPRESSION(S) /  ED DIAGNOSES  Final diagnoses:  Sepsis, due to unspecified organism [facility])  Pyelonephritis      NEW MEDICATIONS STARTED DURING THIS VISIT:  New Prescriptions   No medications on file     Note:  This document was prepared using Dragon voice recognition software and may include unintentional dictation errors.    Nita Sickle, MD 04/11/17 2032

## 2017-04-11 NOTE — H&P (Signed)
[facility]   PATIENT NAME: Glenda Vaughn    MR#:  [id number]  DATE OF BIRTH:  [birth year]  DATE OF ADMISSION:  04/11/2017  PRIMARY CARE PHYSICIAN: Jaclyn Shaggy, MD   REQUESTING/REFERRING PHYSICIAN: Don Perking   CHIEF COMPLAINT:   Chief Complaint  Patient presents with  . Altered Mental Status    HISTORY OF PRESENT ILLNESS: Glenda Vaughn  is a 76 y.o. female with a known history of Alzheimer's dementia, HLD, Htn, Thyroid dz- Lives in assisted living place, Physically completely independent, but terminal dementia. Today from her day care center - had fever and more confusion, so they called daughter and sent to ER. Noted to have sepsis with UTI. Pt is oriented to self and demented, so all history obtained from son and daughter in room.  PAST MEDICAL HISTORY:   Past Medical History:  Diagnosis Date  . Alzheimer's dementia   . Depression   . Diabetes mellitus without complication (HCC)   . Hyperlipidemia   . Hypertension   . Thyroid disease     PAST SURGICAL HISTORY: History reviewed. No pertinent surgical history.  SOCIAL HISTORY:  Social History  Substance Use Topics  . Smoking status: Never Smoker  . Smokeless tobacco: Never Used  . Alcohol use No    FAMILY HISTORY:  Family History  Problem Relation Age of Onset  . Prostate cancer Father     DRUG ALLERGIES: No Known Allergies  REVIEW OF SYSTEMS:   Pt have dementia.  MEDICATIONS AT HOME:  Prior to Admission medications   Medication Sig Start Date End Date Taking? Authorizing Provider  aspirin 81 MG chewable tablet Chew 81 mg by mouth daily.    [provider]  atorvastatin (LIPITOR) 20 MG tablet Take 20 mg by mouth daily at 6 PM.    [provider]  Calcium Carbonate-Vitamin D3 (CALCIUM 600-D) 600-400 MG-UNIT TABS Take 1 tablet by mouth daily.    [provider]  cephALEXin (KEFLEX) 500 MG capsule Take 1 capsule (500 mg total) by mouth 3  (three) times daily. 04/04/16   Katharina Caper, MD  Diphenhydramine-APAP, sleep, (TYLENOL PM EXTRA STRENGTH PO) Take 1 tablet by mouth every evening.    [provider]  donepezil (ARICEPT) 10 MG tablet Take 10 mg by mouth daily.    [provider]  FLUoxetine (PROZAC) 20 MG capsule Take 20 mg by mouth daily.    [provider]  glimepiride (AMARYL) 2 MG tablet Take 2 mg by mouth daily with breakfast.    [provider]  levothyroxine (SYNTHROID, LEVOTHROID) 112 MCG tablet Take 112 mcg by mouth daily before breakfast.    [provider]  loperamide (IMODIUM) 2 MG capsule Take 2 mg by mouth as needed for diarrhea or loose stools.    [provider]  memantine (NAMENDA) 10 MG tablet Take 10 mg by mouth 2 (two) times daily.    [provider]  metFORMIN (GLUCOPHAGE-XR) 500 MG 24 hr tablet Take 500 mg by mouth 2 (two) times daily.    [provider]  Multiple Vitamin (MULTIVITAMIN) tablet Take 1 tablet by mouth daily.    [provider]  naproxen sodium (ANAPROX) 220 MG tablet Take 220 mg by mouth 2 (two) times daily with a meal.    [provider]  Probiotic Product (ALIGN) 4 MG CAPS Take 4 mg by mouth daily.    [provider]  vitamin C (ASCORBIC ACID) 500 MG tablet  Take 500 mg by mouth daily.    [provider]      PHYSICAL EXAMINATION:   VITAL SIGNS: Blood pressure 109/67, pulse 87, temperature 99.2 F (37.3 C), temperature source Oral, resp. rate (!) 21, height 5\' 6"  (1.676 m), weight 65.8 kg (145 lb), SpO2 99 %.  GENERAL:  76 y.o.-year-old patient lying in the bed with no acute distress.  EYES: Pupils equal, round, reactive to light and accommodation. No scleral icterus. Extraocular muscles intact.  HEENT: Head atraumatic, normocephalic. Oropharynx and nasopharynx clear.  NECK:  Supple, no jugular venous distention. No thyroid enlargement, no tenderness.  LUNGS: Normal breath  sounds bilaterally, no wheezing, rales,rhonchi or crepitation. No use of accessory muscles of respiration.  CARDIOVASCULAR: S1, S2 normal. No murmurs, rubs, or gallops.  ABDOMEN: Soft, nontender, nondistended. Bowel sounds present. No organomegaly or mass.  EXTREMITIES: No pedal edema, cyanosis, or clubbing.  NEUROLOGIC: Cranial nerves II through XII are intact. Muscle strength 4-5/5 in all extremities. Sensation intact. Gait not checked.  PSYCHIATRIC: The patient is alert and oriented x 0. SKIN: No obvious rash, lesion, or ulcer.   LABORATORY PANEL:   CBC  Recent Labs Lab 04/11/17 1856  WBC 14.8*  HGB 10.4*  HCT 31.4*  PLT 363  MCV 82.5  MCH 27.4  MCHC 33.2  RDW 16.1*  LYMPHSABS 1.6  MONOABS 1.4*  EOSABS 0.0  BASOSABS 0.1   ------------------------------------------------------------------------------------------------------------------  Chemistries   Recent Labs Lab 04/11/17 1856  NA 130*  K 4.0  CL 97*  CO2 22  GLUCOSE 141*  BUN 19  CREATININE <0.30*  CALCIUM 9.1  AST 16  ALT 10*  ALKPHOS 97  BILITOT 0.6   ------------------------------------------------------------------------------------------------------------------ CrCl cannot be calculated (This lab value cannot be used to calculate CrCl because it is not a number: <0.30). ------------------------------------------------------------------------------------------------------------------ No results for input(s): TSH, T4TOTAL, T3FREE, THYROIDAB in the last 72 hours.  Invalid input(s): FREET3   Coagulation profile  Recent Labs Lab 04/11/17 1856  INR 0.98   ------------------------------------------------------------------------------------------------------------------- No results for input(s): DDIMER in the last 72 hours. -------------------------------------------------------------------------------------------------------------------  Cardiac Enzymes No results for input(s): CKMB, TROPONINI,  MYOGLOBIN in the last 168 hours.  Invalid input(s): CK ------------------------------------------------------------------------------------------------------------------ Invalid input(s): POCBNP  ---------------------------------------------------------------------------------------------------------------  Urinalysis    Component Value Date/Time   COLORURINE YELLOW (A) 04/11/2017 1856   APPEARANCEUR TURBID (A) 04/11/2017 1856   LABSPEC 1.013 04/11/2017 1856   PHURINE 5.0 04/11/2017 1856   GLUCOSEU >=500 (A) 04/11/2017 1856   HGBUR MODERATE (A) 04/11/2017 1856   BILIRUBINUR NEGATIVE 04/11/2017 1856   KETONESUR 5 (A) 04/11/2017 1856   PROTEINUR 100 (A) 04/11/2017 1856   NITRITE POSITIVE (A) 04/11/2017 1856   LEUKOCYTESUR MODERATE (A) 04/11/2017 1856     RADIOLOGY: Dg Chest 2 View  Result Date: 04/11/2017 CLINICAL DATA:  76 year old female with altered mental status and possible sepsis. EXAM: CHEST  2 VIEW COMPARISON:  None. FINDINGS: Minimal bibasilar atelectatic changes. There is no focal consolidation, pleural effusion, or pneumothorax. The cardiac silhouette is within normal limits. There is atherosclerotic calcification of the aortic arch. There is osteopenia with degenerative changes of the spine. There is atherosclerotic calcification of the visualized descending thoracic and abdominal aorta. No acute osseous pathology. IMPRESSION: No active cardiopulmonary disease. Electronically Signed   By: Elgie Collard M.D.   On: 04/11/2017 19:35    EKG: Orders placed or performed during the hospital encounter of 04/11/17  . EKG 12-Lead  . EKG 12-Lead    IMPRESSION AND PLAN:  *  Sepsis   UTI   IV rocephin, Ur an bl cx sent.   IV fluids given, now stable.  * DM    Continue oral meds, keep on ISS>  * Hyperlipidemia   Cont statin.  * dementia, baseline   Cont meds  All the records are reviewed and case discussed with ED provider. Management plans discussed with the  patient, family and they are in agreement.  CODE STATUS: DNR. Code Status History    Date Active Date Inactive Code Status Order ID Comments User Context   04/04/2016  1:06 AM 04/05/2016  4:02 PM DNR [id number]  Tonye RoyaltyHugelmeyer, Alexis, DO Inpatient    Questions for Most Recent Historical Code Status (Order [id number])    Question Answer Comment   In the event of cardiac or respiratory ARREST Do not call a "code blue"    In the event of cardiac or respiratory ARREST Do not perform Intubation, CPR, defibrillation or ACLS    In the event of cardiac or respiratory ARREST Use medication by any route, position, wound care, and other measures to relive pain and suffering. May use oxygen, suction and manual treatment of airway obstruction as needed for comfort.         Advance Directive Documentation     Most Recent Value  Type of Advance Directive  Healthcare Power of Attorney, Out of facility DNR (pink MOST or yellow form)  Pre-existing out of facility DNR order (yellow form or pink MOST form)  -  "MOST" Form in Place?  -      Discussed with daughter  And son on room. TOTAL TIME TAKING CARE OF THIS PATIENT: 45 minutes.    Altamese DillingVACHHANI, Darlina Mccaughey M.D on 04/11/2017   Between 7am to 6pm - Pager - [phone]  After 6pm go to [email or web address] - password [email or web address]  [facility]  Office  [phone]  CC: Primary care physician; Jaclyn Shaggyate, Denny C, MD   Note: This dictation was prepared with Dragon dictation along with smaller phrase technology. Any transcriptional errors that result from this process are unintentional.

## 2017-04-12 LAB — GLUCOSE, CAPILLARY
GLUCOSE-CAPILLARY: 183 mg/dL — AB (ref 65–99)
Glucose-Capillary: 186 mg/dL — ABNORMAL HIGH (ref 65–99)

## 2017-04-12 LAB — BASIC METABOLIC PANEL
ANION GAP: 7 (ref 5–15)
BUN: 17 mg/dL (ref 6–20)
CO2: 24 mmol/L (ref 22–32)
Calcium: 8.4 mg/dL — ABNORMAL LOW (ref 8.9–10.3)
Chloride: 103 mmol/L (ref 101–111)
Creatinine, Ser: 0.77 mg/dL (ref 0.44–1.00)
GFR calc Af Amer: >60 mL/min (ref >60)
GLUCOSE: 193 mg/dL — AB (ref 65–99)
POTASSIUM: 3.5 mmol/L (ref 3.5–5.1)
Sodium: 134 mmol/L — ABNORMAL LOW (ref 135–145)

## 2017-04-12 LAB — CBC
HEMATOCRIT: 29.3 % — AB (ref 35.0–47.0)
HEMOGLOBIN: 9.6 g/dL — AB (ref 12.0–16.0)
MCH: 27.2 pg (ref 26.0–34.0)
MCHC: 32.7 g/dL (ref 32.0–36.0)
MCV: 83.1 fL (ref 80.0–100.0)
Platelets: 321 10*3/uL (ref 150–440)
RBC: 3.53 MIL/uL — ABNORMAL LOW (ref 3.80–5.20)
RDW: 16.1 % — AB (ref 11.5–14.5)
WBC: 12.5 10*3/uL — ABNORMAL HIGH (ref 3.6–11.0)

## 2017-04-12 MED ORDER — CALCIUM CARBONATE-VITAMIN D 500-200 MG-UNIT PO TABS
1.0000 | ORAL_TABLET | Freq: Every day | ORAL | Status: DC
  Administered 2017-04-12: 09:00:00 1 via ORAL
  Filled 2017-04-12: qty 1

## 2017-04-12 MED ORDER — CEPHALEXIN 500 MG PO CAPS: 500.0000 mg | ORAL_CAPSULE | Freq: Two times a day (BID) | ORAL | 0 refills | Status: DC

## 2017-04-12 MED ORDER — RISAQUAD PO CAPS
1.0000 | ORAL_CAPSULE | Freq: Every day | ORAL | Status: DC
  Administered 2017-04-12: 1 via ORAL
  Filled 2017-04-12: qty 1

## 2017-04-12 MED ORDER — CEPHALEXIN 500 MG PO CAPS: 500.0000 mg | ORAL_CAPSULE | Freq: Two times a day (BID) | ORAL | Status: DC

## 2017-04-12 MED ORDER — NAPROXEN 250 MG PO TABS: 250.0000 mg | ORAL_TABLET | Freq: Two times a day (BID) | ORAL | Status: DC

## 2017-04-12 MED ORDER — DIPHENHYDRAMINE HCL 25 MG PO CAPS: 25.0000 mg | ORAL_CAPSULE | Freq: Every evening | ORAL | Status: DC | PRN

## 2017-04-12 MED ORDER — ACETAMINOPHEN 500 MG PO TABS: 500.0000 mg | ORAL_TABLET | Freq: Every evening | ORAL | Status: DC | PRN

## 2017-04-12 MED ORDER — CEFTRIAXONE SODIUM 1 G IJ SOLR
1.0000 g | INTRAMUSCULAR | Status: DC
  Filled 2017-04-12: qty 10

## 2017-04-12 NOTE — Progress Notes (Signed)
Patient admitted for AMS s/t Urosepsis. Patient takes metformin 500 mg twice daily and naproxen 220 mg twice daily with meals.  Considering patient is septic and has a hgb of 9.6 will hold both metformin and naproxen while in house.  Thomasene Rippleavid [place] Kappes, PharmD, BCPS Clinical Pharmacist  04/12/2017

## 2017-04-12 NOTE — Clinical Social Work Note (Signed)
Clinical Social Work Assessment  Patient Details  Name: Glenda Vaughn MRN: [id number] Date of Birth: 1941/03/05  Date of referral:  04/12/17               Reason for consult:  Other (Comment Required) (from [facility] )                Permission sought to share information with:  Chartered certified accountant granted to share information::  Yes, Verbal Permission Granted  Name::      Glenda Vaughn ALF   Agency::     Relationship::     Contact Information:     Housing/Transportation Living arrangements for the past 2 months:  [place] of Information:  Adult Children, Facility Patient Interpreter Needed:  None Criminal Activity/Legal Involvement Pertinent to Current Situation/Hospitalization:  No - Comment as needed Significant Relationships:  Adult Children Lives with:  Facility Resident Do you feel safe going back to the place where you live?  Yes Need for family participation in patient care:  Yes (Comment)  Care giving concerns:  Patient is a resident at [facility].    Social Worker assessment / plan:  Holiday representative (CSW) reviewed chart and noted that patient is from [facility]. Per MD patient is stable for D/C today. CSW met with patient and her daughter/ HPOA Glenda Vaughn [phone] was at bedside. Per daughter patient has been at [facility] care for 3 years and is independent with her ADLs and ambulation. Patient is on room air at baseline. Daughter is agreeable for patient to return to [facility] today and will provide transport. Per med Museum/gallery exhibitions officer at [facility] patient can return today. RN will call report. CSW sent D/C orders to [facility]. Please reconsult if future social work needs arise. CSW signing off.    Employment status:  Disabled (Comment on whether or not currently receiving Disability) Insurance information:  Managed Medicare PT Recommendations:  No Follow Up Information /  Referral to community resources:  Other (Comment Required) (Patient will return to ALF)  Patient/Family's Response to care:  Patient's daughter is agreeable for patient to return to [place].   Patient/Family's Understanding of and Emotional Response to Diagnosis, Current Treatment, and Prognosis:  Patient's daughter was very pleasant and thanked CSW for assistance.   Emotional Assessment Appearance:  Appears stated age Attitude/Demeanor/Rapport:  Unable to Assess Affect (typically observed):  Unable to Assess Orientation:  Oriented to Self, Fluctuating Orientation (Suspected and/or reported Sundowners) Alcohol / Substance use:  Not Applicable Psych involvement (Current and /or in the community):  No (Comment)  Discharge Needs  Concerns to be addressed:  No discharge needs identified Readmission within the last 30 days:  No Current discharge risk:  None Barriers to Discharge:  No Barriers Identified   Glenda Vaughn, Glenda Beets, LCSW 04/12/2017, 1:43 PM

## 2017-04-12 NOTE — Discharge Summary (Addendum)
[facility] -  at [facility]   PATIENT NAME: Glenda PartridgeCarolyn Vaughn    MR#:  [id number]  DATE OF BIRTH:  10/14/1940  DATE OF ADMISSION:  04/11/2017 ADMITTING PHYSICIAN: Altamese DillingVaibhavkumar Vachhani, MD  DATE OF DISCHARGE: 04/12/2017  PRIMARY CARE PHYSICIAN: Jaclyn Shaggyate, Glenda C, MD    ADMISSION DIAGNOSIS:  Pyelonephritis [N12] Sepsis, due to unspecified organism (HCC) [A41.9]  DISCHARGE DIAGNOSIS:  Sepsis on admission UTI Mild hyponatremia secondary to dehydration  SECONDARY DIAGNOSIS:   Past Medical History:  Diagnosis Date  . Alzheimer's dementia   . Depression   . Diabetes mellitus without complication (HCC)   . Hyperlipidemia   . Hypertension   . Thyroid disease     HOSPITAL COURSE:   Glenda PartridgeCarolyn [name]  is a 76 y.o. female with a known history of Alzheimer's dementia, HLD, Htn, Thyroid dz- Lives in assisted living place, Physically completely independent, but terminal dementia. Today from her day care center - had fever and more confusion, so they called daughter and sent to ER.  * Sepsis due to UTI   IV rocephin, Ur an bl   blood cultures negative. Received   IV fluids given Patient is afebrile.  According to the daughter she is at baseline.  No fever spike. Ate good breakfast per daughter. Will change to oral Keflex.  * mild acute hyponatremia -received IVF -na 134  * DM-2    Continue oral meds, keep on ISS>  * Hyperlipidemia   Cont statin.  * dementia, baseline   Cont meds Per daughter patient gets anxious and confused when she is from her environment. Will consider discharging patient if she remains afebrile today. Daughter is agreeable.  She thinks patient will be better  [facility] Patient will complete 7-day course for UTI.  Pt is DNR  CONSULTS OBTAINED:    DRUG ALLERGIES:  No Known Allergies  DISCHARGE MEDICATIONS:   Current Discharge Medication List    CONTINUE these medications which have CHANGED   Details   cephALEXin (KEFLEX) 500 MG capsule Take 1 capsule (500 mg total) by mouth every 12 (twelve) hours. Qty: 14 capsule, Refills: 0      CONTINUE these medications which have NOT CHANGED   Details  Ascorbic Acid (VITAMIN Vaughn) 500 MG CHEW Take 500 mg by mouth daily.    aspirin 81 MG chewable tablet Chew 81 mg by mouth daily.    atorvastatin (LIPITOR) 20 MG tablet Take 20 mg by mouth every evening.     Calcium Carbonate-Vitamin D3 (CALCIUM 600-D) 600-400 MG-UNIT TABS Take 1 tablet by mouth daily.    Diphenhydramine-APAP, sleep, (TYLENOL PM EXTRA STRENGTH PO) Take 1 tablet by mouth every evening.    donepezil (ARICEPT) 10 MG tablet Take 10 mg by mouth daily.    FLUoxetine (PROZAC) 20 MG capsule Take 20 mg by mouth daily.    glimepiride (AMARYL) 4 MG tablet Take 4 mg by mouth 2 (two) times daily.     levothyroxine (SYNTHROID, LEVOTHROID) 125 MCG tablet Take 125 mcg by mouth every morning.     loperamide (IMODIUM) 2 MG capsule Take 2 mg by mouth as needed for diarrhea or loose stools.    memantine (NAMENDA) 10 MG tablet Take 10 mg by mouth 2 (two) times daily.    metFORMIN (GLUCOPHAGE-XR) 500 MG 24 hr tablet Take 500 mg by mouth 2 (two) times daily.    Multiple Vitamins-Minerals (CERTAVITE SENIOR/ANTIOXIDANT) TABS Take 1 tablet by mouth daily.    naproxen sodium (ANAPROX) 220  MG tablet Take 220 mg by mouth 2 (two) times daily with a meal.    Probiotic Product (ALIGN) 4 MG CAPS Take 4 mg by mouth daily.    Multiple Vitamin (MULTIVITAMIN) tablet Take 1 tablet by mouth daily.        If you experience worsening of your admission symptoms, develop shortness of breath, life threatening emergency, suicidal or homicidal thoughts you must seek medical attention immediately by calling 911 or calling your MD immediately  if symptoms less severe.  You Must read complete instructions/literature along with all the possible adverse reactions/side effects for all the Medicines you take and that have  been prescribed to you. Take any new Medicines after you have completely understood and accept all the possible adverse reactions/side effects.   Please note  You were cared for by a hospitalist during your hospital stay. If you have any questions about your discharge medications or the care you received while you were in the hospital after you are discharged, you can call the unit and asked to speak with the hospitalist on call if the hospitalist that took care of you is not available. Once you are discharged, your primary care physician will handle any further medical issues. Please note that NO REFILLS for any discharge medications will be authorized once you are discharged, as it is imperative that you return to your primary care physician (or establish a relationship with a primary care physician if you do not have one) for your aftercare needs so that they can reassess your need for medications and monitor your lab values. Today   SUBJECTIVE   Patient sitting in the recliner.  She answered most of my questions appropriately.  Daughter in the room.  Daughter states patient is back to baseline.  VITAL SIGNS:  Blood pressure 122/62, pulse 90, temperature 99.6 F (37.6 Vaughn), temperature source Oral, resp. rate 16, height 5\' 6"  (1.676 m), weight 65.8 kg (145 lb), SpO2 93 %.  I/O:   Intake/Output Summary (Last 24 hours) at 04/12/17 1121 Last data filed at 04/12/17 1018  Gross per 24 hour  Intake             1410 ml  Output                0 ml  Net             1410 ml    PHYSICAL EXAMINATION:  GENERAL:  76 y.o.-year-old patient lying in the bed with no acute distress.  EYES: Pupils equal, round, reactive to light and accommodation. No scleral icterus. Extraocular muscles intact.  HEENT: Head atraumatic, normocephalic. Oropharynx and nasopharynx clear.  NECK:  Supple, no jugular venous distention. No thyroid enlargement, no tenderness.  LUNGS: Normal breath sounds bilaterally, no wheezing,  rales,rhonchi or crepitation. No use of accessory muscles of respiration.  CARDIOVASCULAR: S1, S2 normal. No murmurs, rubs, or gallops.  ABDOMEN: Soft, non-tender, non-distended. Bowel sounds present. No organomegaly or mass.  EXTREMITIES: No pedal edema, cyanosis, or clubbing.  NEUROLOGIC: Cranial nerves II through XII are intact. Muscle strength 5/5 in all extremities. Sensation intact. Gait not checked.  PSYCHIATRIC: The patient is alert and oriented x 3.  SKIN: No obvious rash, lesion, or ulcer.   DATA REVIEW:   CBC   Recent Labs Lab 04/12/17 0533  WBC 12.5*  HGB 9.6*  HCT 29.3*  PLT 321    Chemistries   Recent Labs Lab 04/11/17 1856 04/12/17 0533  NA 130* 134*  K  4.0 3.5  CL 97* 103  CO2 22 24  GLUCOSE 141* 193*  BUN 19 17  CREATININE <0.30* 0.77  CALCIUM 9.1 8.4*  AST 16  --   ALT 10*  --   ALKPHOS 97  --   BILITOT 0.6  --     Microbiology Results   Recent Results (from the past 240 hour(s))  Culture, blood (Routine x 2)     Status: None (Preliminary result)   Collection Time: 04/11/17  6:56 PM  Result Value Ref Range Status   Specimen Description BLOOD BLOOD LEFT HAND  Final   Special Requests   Final    BOTTLES DRAWN AEROBIC AND ANAEROBIC Blood Culture adequate volume   Culture NO GROWTH < 12 HOURS  Final   Report Status PENDING  Incomplete  Culture, blood (Routine x 2)     Status: None (Preliminary result)   Collection Time: 04/11/17  7:22 PM  Result Value Ref Range Status   Specimen Description BLOOD BLOOD RIGHT FOREARM  Final   Special Requests   Final    BOTTLES DRAWN AEROBIC AND ANAEROBIC Blood Culture adequate volume   Culture NO GROWTH < 12 HOURS  Final   Report Status PENDING  Incomplete    RADIOLOGY:  Dg Chest 2 View  Result Date: 04/11/2017 CLINICAL DATA:  76 year old female with altered mental status and possible sepsis. EXAM: CHEST  2 VIEW COMPARISON:  None. FINDINGS: Minimal bibasilar atelectatic changes. There is no focal  consolidation, pleural effusion, or pneumothorax. The cardiac silhouette is within normal limits. There is atherosclerotic calcification of the aortic arch. There is osteopenia with degenerative changes of the spine. There is atherosclerotic calcification of the visualized descending thoracic and abdominal aorta. No acute osseous pathology. IMPRESSION: No active cardiopulmonary disease. Electronically Signed   By: Elgie Collard M.D.   On: 04/11/2017 19:35     Management plans discussed with the patient, family and they are in agreement.  CODE STATUS:     Code Status Orders        Start     Ordered   04/11/17 2237  Do not attempt resuscitation (DNR)  Continuous    Question Answer Comment  In the event of cardiac or respiratory ARREST Do not call a "code blue"   In the event of cardiac or respiratory ARREST Do not perform Intubation, CPR, defibrillation or ACLS   In the event of cardiac or respiratory ARREST Use medication by any route, position, wound care, and other measures to relive pain and suffering. May use oxygen, suction and manual treatment of airway obstruction as needed for comfort.      04/11/17 2236    Code Status History    Date Active Date Inactive Code Status Order ID Comments User Context   04/04/2016  1:06 AM 04/05/2016  4:02 PM DNR [id number]  [name], DO Inpatient    Advance Directive Documentation     Most Recent Value  Type of Advance Directive  Healthcare Power of Attorney, Living will  Pre-existing out of facility DNR order (yellow form or pink MOST form)  -  "MOST" Form in Place?  -      TOTAL TIME TAKING CARE OF THIS PATIENT: *40* minutes.    Joeanthony Seeling M.D on 04/12/2017 at 11:21 AM  Between 7am to 6pm - Pager - [phone] After 6pm go to [email or web address] - Social research officer, government  [facility]  Office  [phone]  CC: Primary care physician; Jaclyn Shaggy, MD

## 2017-04-12 NOTE — Progress Notes (Signed)
Inpatient Diabetes Program Recommendations  AACE/ADA: New Consensus Statement on Inpatient Glycemic Control (2015)  Target Ranges:  Prepandial:   less than 140 mg/dL      Peak postprandial:   less than 180 mg/dL (1-2 hours)      Critically ill patients:  140 - 180 mg/dL   Lab Results  Component Value Date   GLUCAP 186 (H) 04/12/2017   HGBA1C 6.8 (H) 04/04/2016    Review of Glycemic Control  Results for Glenda Vaughn, [name] (MRN [id number]) as of 04/12/2017 09:18  Ref. Range 04/11/2017 23:25 04/12/2017 07:52  Glucose-Capillary Latest Ref Range: 65 - 99 mg/dL 409266 (H) 811186 (H)    Diabetes history: Type 2 Outpatient Diabetes medications: Amaryl 4mg  bid, Metformin 500mg  bid  Current orders for Inpatient glycemic control: Novolog 0-9 units tid  Inpatient Diabetes Program Recommendations:   Please change diet to carb modified.         Consider adding Novolog 0-5 units qhs   Per ADA recommendations "consider performing an A1C on all patients with diabetes or hyperglycemia admitted to the hospital if not performed in the prior 3 months".  Susette RacerJulie Levert Heslop, RN, BA, MHA, CDE Diabetes Coordinator Inpatient Diabetes Program  [phone] (Team Pager) [phone] [facility] Office) 04/12/2017 9:21 AM

## 2017-04-12 NOTE — NC FL2 (Signed)
[place] MEDICAID FL2 LEVEL OF CARE SCREENING TOOL     IDENTIFICATION  Patient Name: Glenda Vaughn Birthdate: [birth year] Sex: female Admission Date (Current Location): 04/11/2017  [place]   Facility and Address:  [facility], [address], [place], [place] [address]      Provider Number: [id number]  Attending Physician Name and Address:  Enedina FinnerPatel, Sona, MD  Relative Name and Phone Number:       Current Level of Care: Hospital Recommended Level of Care: Assisted Living Facility, Memory Care Prior Approval Number:    Date Approved/Denied:   PASRR Number:    Discharge Plan: Domiciliary (Rest home) (Memory Care )    Current Diagnoses: Primary: Alzheimer's Dementia Patient Active Problem List   Diagnosis Date Noted  . Sepsis (HCC) 04/11/2017  . Elevated troponin 04/04/2016  . Fall 04/04/2016  . Alzheimer's dementia 04/04/2016  . Generalized weakness 04/04/2016  . Acute cystitis without hematuria 04/04/2016  . Iron deficiency anemia 04/04/2016  . Hyponatremia 04/04/2016  . Mild aortic stenosis 04/04/2016  . Urinary tract infection with hematuria     Orientation RESPIRATION BLADDER Height & Weight     Self  Normal Continent Weight: 145 lb (65.8 kg) Height:  5\' 6"  (167.6 cm)  BEHAVIORAL SYMPTOMS/MOOD NEUROLOGICAL BOWEL NUTRITION STATUS      Continent Diet (Diet: Regular )  AMBULATORY STATUS COMMUNICATION OF NEEDS Skin   Supervision Verbally Normal                       Personal Care Assistance Level of Assistance  Bathing, Feeding, Dressing Bathing Assistance: Limited assistance Feeding assistance: Independent Dressing Assistance: Limited assistance     Functional Limitations Info  Sight, Hearing, Speech Sight Info: Adequate Hearing Info: Adequate Speech Info: Adequate    SPECIAL CARE FACTORS FREQUENCY                       Contractures      Additional Factors Info  Code  Status, Allergies Code Status Info:  (DNR ) Allergies Info:  (No Known Allergies. )          Discharge Medications: Please see discharge summary for a list of discharge medications. Current Discharge Medication List       CONTINUE these medications which have CHANGED   Details  cephALEXin (KEFLEX) 500 MG capsule Take 1 capsule (500 mg total) by mouth every 12 (twelve) hours. Qty: 14 capsule, Refills: 0         CONTINUE these medications which have NOT CHANGED   Details  Ascorbic Acid (VITAMIN C) 500 MG CHEW Take 500 mg by mouth daily.    aspirin 81 MG chewable tablet Chew 81 mg by mouth daily.    atorvastatin (LIPITOR) 20 MG tablet Take 20 mg by mouth every evening.     Calcium Carbonate-Vitamin D3 (CALCIUM 600-D) 600-400 MG-UNIT TABS Take 1 tablet by mouth daily.    Diphenhydramine-APAP, sleep, (TYLENOL PM EXTRA STRENGTH PO) Take 1 tablet by mouth every evening.    donepezil (ARICEPT) 10 MG tablet Take 10 mg by mouth daily.    FLUoxetine (PROZAC) 20 MG capsule Take 20 mg by mouth daily.    glimepiride (AMARYL) 4 MG tablet Take 4 mg by mouth 2 (two) times daily.     levothyroxine (SYNTHROID, LEVOTHROID) 125 MCG tablet Take 125 mcg by mouth every morning.     loperamide (IMODIUM) 2 MG capsule Take 2  mg by mouth as needed for diarrhea or loose stools.    memantine (NAMENDA) 10 MG tablet Take 10 mg by mouth 2 (two) times daily.    metFORMIN (GLUCOPHAGE-XR) 500 MG 24 hr tablet Take 500 mg by mouth 2 (two) times daily.    Multiple Vitamins-Minerals (CERTAVITE SENIOR/ANTIOXIDANT) TABS Take 1 tablet by mouth daily.    naproxen sodium (ANAPROX) 220 MG tablet Take 220 mg by mouth 2 (two) times daily with a meal.    Probiotic Product (ALIGN) 4 MG CAPS Take 4 mg by mouth daily.    Multiple Vitamin (MULTIVITAMIN) tablet Take 1 tablet by mouth daily.      Relevant Imaging Results: Relevant Lab Results: Additional Information  (SSN:  [id number])  Ravenne Wayment, Darleen Crocker, LCSW

## 2017-04-12 NOTE — Care Management Obs Status (Signed)
MEDICARE OBSERVATION STATUS NOTIFICATION   Patient Details  Name: Glenda Vaughn MRN: [id number] Date of Birth: [birth year]   Medicare Observation Status Notification Given:  Yes    Marily MemosLisa M Jeweldean Drohan, RN 04/12/2017, 12:10 PM

## 2017-04-12 NOTE — Care Management CC44 (Signed)
Condition Code 44 Documentation Completed  Patient Details  Name: Tobias AlexanderCarolyn L Wertenberger MRN: [id number] Date of Birth: [birth year]   Condition Code 44 given:  Yes Patient signature on Condition Code 44 notice:  Yes Documentation of 2 MD's agreement:  Yes Code 44 added to claim:  Yes    Marily MemosLisa M Atianna Haidar, RN 04/12/2017, 12:10 PM

## 2017-04-12 NOTE — Progress Notes (Addendum)
[facility] Daughter is here and will transport patient. [facility], belongings packed and NT took patient to car. Discharge paperwork given to daughter.

## 2017-04-12 NOTE — Progress Notes (Signed)
Pt is Alert and oriented only to self. POC explained to pt and family. Pt urinates frequently. IV wrapped. Pt pulls at IV and dressing. Pt continues to attempt to get up for other reasons. Needs frequent direction and re-orientation. Low bed ordered. Tele sitter in room. Will continue to monitor.

## 2017-04-12 NOTE — Evaluation (Signed)
Physical Therapy Evaluation Patient Details Name: Glenda Vaughn MRN: [id number] DOB: 1940/10/15 Today's Date: 04/12/2017   History of Present Illness  76 y/o female here with diagnosis of sepsis.  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  She appears to be back to her baseline at the time of PT exam.   Clinical Impression  Pt limited with ability to fully follow instructions, but was generally safe with her baseline mobility and ambulation.  She easily and confidently walked around the nurses' station w/o AD and per daughter was at her prior level of function.  Pt needed light cuing for safety awareness, but regarding PT needs did well.  Encouraged daughter to insure that she stays active, but she agreed that she does not require further PT intervention.  Pt safe to return to ALF.     Follow Up Recommendations No PT follow up (pt goes to [facility] day care 3x/wk)    Equipment Recommendations       Recommendations for Other Services       Precautions / Restrictions Precautions Precautions: Fall Restrictions Weight Bearing Restrictions: No      Mobility  Bed Mobility Overal bed mobility: Independent             General bed mobility comments: Pt able to get up to sitting EOB and don socks/shoes  Transfers Overall transfer level: Independent Equipment used: None             General transfer comment: Pt able to rise to standing safely and w/o generally assist.  Light cuing for set up but otherwise [facility]  Ambulation/Gait Ambulation/Gait assistance: Modified independent (Device/Increase time) Ambulation Distance (Feet): 200 Feet Assistive device: None       General Gait Details: Pt was able to ambulate in the hallway w/o issue, she does have (L>R) toe out posture due to very flat arches and occasionally scuffing of bottom of shoe, but no LOBs or overt safety issues.  Daughter  present and report that she appears grossly at her baseline.  Overall pt confident and safe with ambualtion.   Stairs            Wheelchair Mobility    Modified Rankin (Stroke Patients Only)       Balance                                             Pertinent Vitals/Pain Pain Assessment: No/denies pain    Home Living Family/patient expects to be discharged to:: Assisted living               Home Equipment:  (pt does not (and will not) use)      Prior Function Level of Independence: Independent         Comments: Pt needs typical memory care assist, but phyiscally is able to walk and do all she needs     Hand Dominance        Extremity/Trunk Assessment   Upper Extremity Assessment Upper Extremity Assessment: Generalized weakness (grossly 3+/5)    Lower Extremity Assessment Lower Extremity Assessment: Generalized weakness (grossly 4-/5)       Communication   Communication: Expressive difficulties (pleasantly confused t/o session)  Cognition Arousal/Alertness: Awake/alert Behavior During Therapy: WFL for tasks assessed/performed Overall Cognitive Status: History of cognitive  impairments - at baseline                                 General Comments: Pt able to state her name and birthday, but otherwise not oriented to time, place, situation, etc      General Comments      Exercises     Assessment/Plan    PT Assessment Patent does not need any further PT services  PT Problem List         PT Treatment Interventions      PT Goals (Current goals can be found in the Care Plan section)  Acute Rehab PT Goals Patient Stated Goal: daughter ready to get [facility] PT Goal Formulation: All assessment and education complete, DC therapy    Frequency     Barriers to discharge        Co-evaluation               AM-PAC PT "6 Clicks" Daily Activity  Outcome Measure Difficulty  turning over in bed (including adjusting bedclothes, sheets and blankets)?: None Difficulty moving from lying on back to sitting on the side of the bed? : None Difficulty sitting down on and standing up from a chair with arms (e.g., wheelchair, bedside commode, etc,.)?: None Help needed moving to and from a bed to chair (including a wheelchair)?: None Help needed walking in hospital room?: None Help needed climbing 3-5 steps with a railing? : None 6 Click Score: 24    End of Session Equipment Utilized During Treatment: Gait belt Activity Tolerance: Patient tolerated treatment well Patient left: with bed alarm set;with call bell/phone within reach;with family/visitor present Nurse Communication: Mobility status PT Visit Diagnosis: Muscle weakness (generalized) (M62.81);Difficulty in walking, not elsewhere classified (R26.2)    Time: [id number] PT Time Calculation (min) (ACUTE ONLY): 17 min   Charges:   PT Evaluation $PT Eval Low Complexity: 1 Low     PT G Codes:   PT G-Codes **NOT FOR INPATIENT CLASS** Functional Assessment Tool Used: AM-PAC 6 Clicks Basic Mobility Functional Limitation: Mobility: Walking and moving around Mobility: Walking and Moving Around Current Status (Y7829(G8978): 0 percent impaired, limited or restricted Mobility: Walking and Moving Around Goal Status (F6213(G8979): 0 percent impaired, limited or restricted Mobility: Walking and Moving Around Discharge Status (Y8657(G8980): 0 percent impaired, limited or restricted    Malachi ProGalen R Babbie Dondlinger, DPT 04/12/2017, 10:51 AM

## 2017-04-14 LAB — URINE CULTURE

## 2017-04-15 NOTE — Progress Notes (Signed)
ED Antimicrobial Stewardship Positive Culture Follow Up   Glenda AlexanderCarolyn L Vaughn is an 76 y.o. female who [facility] to [facility] and was discharged on cephalexin for UTI but urine culture resulted in E coli resistant to cefazolin.   Chief Complaint  Patient presents with  . Altered Mental Status    Recent Results (from the past 720 hour(s))  Culture, blood (Routine x 2)     Status: None (Preliminary result)   Collection Time: 04/11/17  6:56 PM  Result Value Ref Range Status   Specimen Description BLOOD BLOOD LEFT HAND  Final   Special Requests   Final    BOTTLES DRAWN AEROBIC AND ANAEROBIC Blood Culture adequate volume   Culture NO GROWTH 4 DAYS  Final   Report Status PENDING  Incomplete  Urine culture     Status: Abnormal   Collection Time: 04/11/17  6:56 PM  Result Value Ref Range Status   Specimen Description URINE, RANDOM  Final   Special Requests NONE  Final   Culture >=100,000 COLONIES/mL ESCHERICHIA COLI (A)  Final   Report Status 04/14/2017 FINAL  Final   Organism ID, Bacteria ESCHERICHIA COLI (A)  Final      Susceptibility   Escherichia coli - MIC*    AMPICILLIN >=32 RESISTANT Resistant     CEFAZOLIN >=64 RESISTANT Resistant     CEFTRIAXONE <=1 SENSITIVE Sensitive     CIPROFLOXACIN >=4 RESISTANT Resistant     GENTAMICIN <=1 SENSITIVE Sensitive     IMIPENEM <=0.25 SENSITIVE Sensitive     NITROFURANTOIN <=16 SENSITIVE Sensitive     TRIMETH/SULFA <=20 SENSITIVE Sensitive     AMPICILLIN/SULBACTAM >=32 RESISTANT Resistant     PIP/TAZO 64 INTERMEDIATE Intermediate     Extended ESBL NEGATIVE Sensitive     * >=100,000 COLONIES/mL ESCHERICHIA COLI  Culture, blood (Routine x 2)     Status: None (Preliminary result)   Collection Time: 04/11/17  7:22 PM  Result Value Ref Range Status   Specimen Description BLOOD BLOOD RIGHT FOREARM  Final   Special Requests   Final    BOTTLES DRAWN AEROBIC AND ANAEROBIC Blood Culture adequate volume   Culture NO GROWTH 4 DAYS   Final   Report Status PENDING  Incomplete    [x]  Treated with cephalexin, organism resistant to prescribed antimicrobial  []  Patient discharged originally without antimicrobial agent and treatment is now indicated  New antibiotic prescription: Macrobid 100 mg bid x 7 days, no refills  Provider: Dr. Mills KollerVipul Shah   Glenda Vaughn 04/15/2017, 4:18 PM

## 2017-04-15 NOTE — Progress Notes (Signed)
ED Antimicrobial Stewardship Positive Culture Follow Up   Glenda AlexanderCarolyn L Vaughn is an 76 y.o. female who presented to [facility] on 04/11/2017 and was discharged on cephalexin for UTI but urine culture resulted in E coli resistant to cefazolin.   Chief Complaint  Patient presents with  . Altered Mental Status    Recent Results (from the past 720 hour(s))  Culture, blood (Routine x 2)     Status: None (Preliminary result)   Collection Time: 04/11/17  6:56 PM  Result Value Ref Range Status   Specimen Description BLOOD BLOOD LEFT HAND  Final   Special Requests   Final    BOTTLES DRAWN AEROBIC AND ANAEROBIC Blood Culture adequate volume   Culture NO GROWTH 4 DAYS  Final   Report Status PENDING  Incomplete  Urine culture     Status: Abnormal   Collection Time: 04/11/17  6:56 PM  Result Value Ref Range Status   Specimen Description URINE, RANDOM  Final   Special Requests NONE  Final   Culture >=100,000 COLONIES/mL ESCHERICHIA COLI (A)  Final   Report Status 04/14/2017 FINAL  Final   Organism ID, Bacteria ESCHERICHIA COLI (A)  Final      Susceptibility   Escherichia coli - MIC*    AMPICILLIN >=32 RESISTANT Resistant     CEFAZOLIN >=64 RESISTANT Resistant     CEFTRIAXONE <=1 SENSITIVE Sensitive     CIPROFLOXACIN >=4 RESISTANT Resistant     GENTAMICIN <=1 SENSITIVE Sensitive     IMIPENEM <=0.25 SENSITIVE Sensitive     NITROFURANTOIN <=16 SENSITIVE Sensitive     TRIMETH/SULFA <=20 SENSITIVE Sensitive     AMPICILLIN/SULBACTAM >=32 RESISTANT Resistant     PIP/TAZO 64 INTERMEDIATE Intermediate     Extended ESBL NEGATIVE Sensitive     * >=100,000 COLONIES/mL ESCHERICHIA COLI  Culture, blood (Routine x 2)     Status: None (Preliminary result)   Collection Time: 04/11/17  7:22 PM  Result Value Ref Range Status   Specimen Description BLOOD BLOOD RIGHT FOREARM  Final   Special Requests   Final    BOTTLES DRAWN AEROBIC AND ANAEROBIC Blood Culture adequate volume   Culture NO GROWTH 4 DAYS   Final   Report Status PENDING  Incomplete    [x]  Treated with cephalexin, organism resistant to prescribed antimicrobial  []  Patient discharged originally without antimicrobial agent and treatment is now indicated  New antibiotic prescription: Macrobid 100 mg bid x 7 days, no refills  Provider: Dr. Mills KollerVipul Shah   [name] 04/15/2017, 4:14 PM

## 2017-04-16 LAB — CULTURE, BLOOD (ROUTINE X 2)
Culture: NO GROWTH
Culture: NO GROWTH
SPECIAL REQUESTS: ADEQUATE
Special Requests: ADEQUATE

## 2017-04-25 ENCOUNTER — Inpatient Hospital Stay
Admission: EM | Admit: 2017-04-25 | Discharge: 2017-04-28 | DRG: 690 | Disposition: A | Discharge status: Skilled Nursing Facility | Payer: Medicare Other | Attending: Internal Medicine | Admitting: Internal Medicine

## 2017-04-25 ENCOUNTER — Emergency Department: Payer: Medicare Other

## 2017-04-25 DIAGNOSIS — G9349 Other encephalopathy: Secondary | ICD-10-CM | POA: Diagnosis present

## 2017-04-25 DIAGNOSIS — G309 Alzheimer's disease, unspecified: Secondary | ICD-10-CM | POA: Diagnosis present

## 2017-04-25 DIAGNOSIS — N3001 Acute cystitis with hematuria: Secondary | ICD-10-CM | POA: Diagnosis not present

## 2017-04-25 DIAGNOSIS — Z7982 Long term (current) use of aspirin: Secondary | ICD-10-CM | POA: Diagnosis not present

## 2017-04-25 DIAGNOSIS — G9340 Encephalopathy, unspecified: Secondary | ICD-10-CM | POA: Diagnosis present

## 2017-04-25 DIAGNOSIS — N390 Urinary tract infection, site not specified: Secondary | ICD-10-CM | POA: Diagnosis not present

## 2017-04-25 DIAGNOSIS — B9620 Unspecified Escherichia coli [E. coli] as the cause of diseases classified elsewhere: Secondary | ICD-10-CM | POA: Diagnosis present

## 2017-04-25 DIAGNOSIS — Z791 Long term (current) use of non-steroidal anti-inflammatories (NSAID): Secondary | ICD-10-CM

## 2017-04-25 DIAGNOSIS — E785 Hyperlipidemia, unspecified: Secondary | ICD-10-CM | POA: Diagnosis present

## 2017-04-25 DIAGNOSIS — R739 Hyperglycemia, unspecified: Secondary | ICD-10-CM

## 2017-04-25 DIAGNOSIS — D638 Anemia in other chronic diseases classified elsewhere: Secondary | ICD-10-CM | POA: Diagnosis present

## 2017-04-25 DIAGNOSIS — Z7984 Long term (current) use of oral hypoglycemic drugs: Secondary | ICD-10-CM | POA: Diagnosis not present

## 2017-04-25 DIAGNOSIS — E039 Hypothyroidism, unspecified: Secondary | ICD-10-CM | POA: Diagnosis present

## 2017-04-25 DIAGNOSIS — D509 Iron deficiency anemia, unspecified: Secondary | ICD-10-CM | POA: Diagnosis present

## 2017-04-25 DIAGNOSIS — E1165 Type 2 diabetes mellitus with hyperglycemia: Secondary | ICD-10-CM | POA: Diagnosis present

## 2017-04-25 DIAGNOSIS — R296 Repeated falls: Secondary | ICD-10-CM | POA: Diagnosis present

## 2017-04-25 DIAGNOSIS — Z79899 Other long term (current) drug therapy: Secondary | ICD-10-CM

## 2017-04-25 DIAGNOSIS — Z66 Do not resuscitate: Secondary | ICD-10-CM | POA: Diagnosis present

## 2017-04-25 DIAGNOSIS — I10 Essential (primary) hypertension: Secondary | ICD-10-CM | POA: Diagnosis present

## 2017-04-25 DIAGNOSIS — F0281 Dementia in other diseases classified elsewhere with behavioral disturbance: Secondary | ICD-10-CM | POA: Diagnosis present

## 2017-04-25 DIAGNOSIS — F329 Major depressive disorder, single episode, unspecified: Secondary | ICD-10-CM | POA: Diagnosis present

## 2017-04-25 LAB — CBC
HCT: 28.6 % — ABNORMAL LOW (ref 35.0–47.0)
Hemoglobin: 9.1 g/dL — ABNORMAL LOW (ref 12.0–16.0)
MCH: 27.2 pg (ref 26.0–34.0)
MCHC: 31.8 g/dL — AB (ref 32.0–36.0)
MCV: 85.4 fL (ref 80.0–100.0)
Platelets: 341 10*3/uL (ref 150–440)
RBC: 3.34 MIL/uL — ABNORMAL LOW (ref 3.80–5.20)
RDW: 16.9 % — AB (ref 11.5–14.5)
WBC: 12.9 10*3/uL — AB (ref 3.6–11.0)

## 2017-04-25 LAB — URINALYSIS, COMPLETE (UACMP) WITH MICROSCOPIC
BILIRUBIN URINE: NEGATIVE
Ketones, ur: 5 mg/dL — AB
NITRITE: NEGATIVE
PH: 5 (ref 5.0–8.0)
Protein, ur: NEGATIVE mg/dL
SPECIFIC GRAVITY, URINE: 1.022 (ref 1.005–1.030)

## 2017-04-25 LAB — GLUCOSE, CAPILLARY: Glucose-Capillary: 230 mg/dL — ABNORMAL HIGH (ref 65–99)

## 2017-04-25 LAB — BASIC METABOLIC PANEL
ANION GAP: 13 (ref 5–15)
BUN: 20 mg/dL (ref 6–20)
CALCIUM: 8.4 mg/dL — AB (ref 8.9–10.3)
CO2: 19 mmol/L — ABNORMAL LOW (ref 22–32)
Chloride: 103 mmol/L (ref 101–111)
Creatinine, Ser: 0.9 mg/dL (ref 0.44–1.00)
GFR calc Af Amer: >60 mL/min (ref >60)
GLUCOSE: 361 mg/dL — AB (ref 65–99)
Potassium: 3.9 mmol/L (ref 3.5–5.1)
SODIUM: 135 mmol/L (ref 135–145)

## 2017-04-25 MED ORDER — CEFTRIAXONE SODIUM IN DEXTROSE 20 MG/ML IV SOLN
1.0000 g | Freq: Once | INTRAVENOUS | Status: AC
  Administered 2017-04-25: 1 g via INTRAVENOUS
  Filled 2017-04-25: qty 50

## 2017-04-25 MED ORDER — NITROFURANTOIN MONOHYD MACRO 100 MG PO CAPS: 100.0000 mg | ORAL_CAPSULE | Freq: Two times a day (BID) | ORAL | 0 refills | Status: DC

## 2017-04-25 MED ORDER — CEFTRIAXONE SODIUM IN DEXTROSE 20 MG/ML IV SOLN
1.0000 g | INTRAVENOUS | Status: DC
  Filled 2017-04-25: qty 50

## 2017-04-25 MED ORDER — SODIUM CHLORIDE 0.9 % IV BOLUS (SEPSIS)
500.0000 mL | Freq: Once | INTRAVENOUS | Status: AC
  Administered 2017-04-25: 500 mL via INTRAVENOUS

## 2017-04-25 MED ORDER — INSULIN ASPART 100 UNIT/ML ~~LOC~~ SOLN
0.0000 [IU] | Freq: Every day | SUBCUTANEOUS | Status: DC
  Administered 2017-04-26: 2 [IU] via SUBCUTANEOUS
  Filled 2017-04-25: qty 1

## 2017-04-25 MED ORDER — INSULIN ASPART 100 UNIT/ML ~~LOC~~ SOLN
0.0000 [IU] | Freq: Three times a day (TID) | SUBCUTANEOUS | Status: DC
  Administered 2017-04-26: 2 [IU] via SUBCUTANEOUS
  Administered 2017-04-26 – 2017-04-27 (×2): 3 [IU] via SUBCUTANEOUS
  Administered 2017-04-28 (×2): 2 [IU] via SUBCUTANEOUS
  Filled 2017-04-25 (×5): qty 1

## 2017-04-25 NOTE — ED Notes (Addendum)
Pt placed on bedpan. Bed alarm placed on patient.  High fall risk armband on patient.

## 2017-04-25 NOTE — ED Triage Notes (Addendum)
[facility]. EMS reports pt CBG 479. 20g to L forearm started and 250cc Ns infused by EMS. Pt in NAD. At baseline mentality per EMS. Report increase in recent falls. No obvious injuries.  Pt states that his home health nurse came to his house and called EMS.

## 2017-04-25 NOTE — ED Provider Notes (Addendum)
[facility]  ____________________________________________   I have reviewed the triage vital signs and the nursing notes.   HISTORY  Chief Complaint Hyperglycemia and Fall    HPI Glenda AlexanderCarolyn L Vaughn is a 76 y.o. female who presents today complaining of nothing.  She was actually sent in because her sugar was high and she has a history of frequent falls and they were concerned about her elevated sugar however her daughter, who is with her, reveals that the patient was given a pumpkin cupcake immediately before her sugar was checked.  Patient is otherwise at her baseline.  She does have urinary frequency and does have recurrent urinary tract infections and does seem to be having recent urinary frequency but no fever no vomiting no chest pain no nausea no other complaints.  She is completely at her baseline per family.  She has an unsteady gait at baseline but refuses to use a walker and this is not new.    Past Medical History:  Diagnosis Date  . Alzheimer's dementia   . Depression   . Diabetes mellitus without complication (HCC)   . Hyperlipidemia   . Hypertension   . Thyroid disease     Patient Active Problem List   Diagnosis Date Noted  . Sepsis (HCC) 04/11/2017  . Elevated troponin 04/04/2016  . Fall 04/04/2016  . Alzheimer's dementia 04/04/2016  . Generalized weakness 04/04/2016  . Acute cystitis without hematuria 04/04/2016  . Iron deficiency anemia 04/04/2016  . Hyponatremia 04/04/2016  . Mild aortic stenosis 04/04/2016  . Urinary tract infection with hematuria     History reviewed. No pertinent surgical history.  Prior to Admission medications   Medication Sig Start Date End Date Taking? Authorizing Provider  Ascorbic Acid (VITAMIN C) 500 MG CHEW Take 500 mg by mouth daily.    [provider]  aspirin 81 MG chewable tablet Chew 81 mg by mouth daily.    [provider]  atorvastatin  (LIPITOR) 20 MG tablet Take 20 mg by mouth every evening.     [provider]  Calcium Carbonate-Vitamin D3 (CALCIUM 600-D) 600-400 MG-UNIT TABS Take 1 tablet by mouth daily.    [provider]  cephALEXin (KEFLEX) 500 MG capsule Take 1 capsule (500 mg total) by mouth every 12 (twelve) hours. 04/12/17   Enedina FinnerPatel, Sona, MD  Diphenhydramine-APAP, sleep, (TYLENOL PM EXTRA STRENGTH PO) Take 1 tablet by mouth every evening.    [provider]  donepezil (ARICEPT) 10 MG tablet Take 10 mg by mouth daily.    [provider]  FLUoxetine (PROZAC) 20 MG capsule Take 20 mg by mouth daily.    [provider]  glimepiride (AMARYL) 4 MG tablet Take 4 mg by mouth 2 (two) times daily.     [provider]  levothyroxine (SYNTHROID, LEVOTHROID) 125 MCG tablet Take 125 mcg by mouth every morning.     [provider]  loperamide (IMODIUM) 2 MG capsule Take 2 mg by mouth as needed for diarrhea or loose stools.    [provider]  memantine (NAMENDA) 10 MG tablet Take 10 mg by mouth 2 (two) times daily.    [provider]  metFORMIN (GLUCOPHAGE-XR) 500 MG 24 hr tablet Take 500 mg by mouth 2 (two) times daily.    [provider]  Multiple Vitamin (MULTIVITAMIN) tablet Take 1 tablet by mouth daily.    [provider]  Multiple Vitamins-Minerals (CERTAVITE SENIOR/ANTIOXIDANT) TABS Take 1 tablet by mouth  daily.    [provider]  naproxen sodium (ANAPROX) 220 MG tablet Take 220 mg by mouth 2 (two) times daily with a meal.    [provider]  Probiotic Product (ALIGN) 4 MG CAPS Take 4 mg by mouth daily.    [provider]    Allergies Patient has no known allergies.  Family History  Problem Relation Age of Onset  . Prostate cancer Father     Social History Social History   Tobacco Use  . Smoking status: Never Smoker  . Smokeless tobacco: Never Used  Substance Use Topics  . Alcohol use: No   . Drug use: No    Review of Systems Constitutional: No fever/chills Eyes: No visual changes. ENT: No sore throat. No stiff neck no neck pain Cardiovascular: Denies chest pain. Respiratory: Denies shortness of breath. Gastrointestinal:   no vomiting.  No diarrhea.  No constipation. Genitourinary: Negative for dysuria. Musculoskeletal: Negative lower extremity swelling Skin: Negative for rash. Neurological: Negative for severe headaches, focal weakness or numbness.   ____________________________________________   PHYSICAL EXAM:  VITAL SIGNS: ED Triage Vitals  Enc Vitals Group     BP 04/25/17 1808 (!) 145/69     Pulse Rate 04/25/17 1809 (!) 103     Resp 04/25/17 1810 18     Temp 04/25/17 1810 99.7 F (37.6 C)     Temp Source 04/25/17 1810 Oral     SpO2 04/25/17 1809 98 %     Weight 04/25/17 1810 145 lb (65.8 kg)     Height 04/25/17 1810 5\' 6"  (1.676 m)     Head Circumference --      Peak Flow --      Pain Score --      Pain Loc --      Pain Edu? --      Excl. in GC? --     Constitutional: Alert and oriented to name and place unsure of the date pleasantly demented no acute distress. Well appearing and in no acute distress. Eyes: Conjunctivae are normal Head: Atraumatic HEENT: No congestion/rhinnorhea. Mucous membranes are moist.  Oropharynx non-erythematous Neck:   Nontender with no meningismus, no masses, no stridor Cardiovascular: Normal rate, regular rhythm. Grossly normal heart sounds.  Good peripheral circulation. Respiratory: Normal respiratory effort.  No retractions. Lungs CTAB. Abdominal: Soft and nontender. No distention. No guarding no rebound Back:  There is no focal tenderness or step off.  there is no midline tenderness there are no lesions noted. there is no CVA tenderness Musculoskeletal: No lower extremity tenderness, no upper extremity tenderness. No joint effusions, no DVT signs strong distal pulses no edema Neurologic:  Normal speech and language.  No gross focal neurologic deficits are appreciated.  Skin:  Skin is warm, dry and intact. No rash noted. Psychiatric: Mood and affect are normal. Speech and behavior are normal.  ____________________________________________   LABS (all labs ordered are listed, but only abnormal results are displayed)  Labs Reviewed  BASIC METABOLIC PANEL - Abnormal; Notable for the following components:      Result Value   CO2 19 (*)    Glucose, Bld 361 (*)    Calcium 8.4 (*)    All other components within normal limits  CBC - Abnormal; Notable for the following components:   WBC 12.9 (*)    RBC 3.34 (*)    Hemoglobin 9.1 (*)    HCT 28.6 (*)    MCHC 31.8 (*)    RDW 16.9 (*)  All other components within normal limits  URINALYSIS, COMPLETE (UACMP) WITH MICROSCOPIC - Abnormal; Notable for the following components:   Color, Urine YELLOW (*)    APPearance CLOUDY (*)    Glucose, UA >=500 (*)    Hgb urine dipstick SMALL (*)    Ketones, ur 5 (*)    Leukocytes, UA LARGE (*)    Bacteria, UA RARE (*)    Squamous Epithelial / LPF 0-5 (*)    All other components within normal limits  CBG MONITORING, ED    Pertinent labs  results that were available during my care of the patient were reviewed by me and considered in my medical decision making (see chart for details). ____________________________________________  EKG  I personally interpreted any EKGs ordered by me or triage  ____________________________________________  RADIOLOGY  Pertinent labs & imaging results that were available during my care of the patient were reviewed by me and considered in my medical decision making (see chart for details). If possible, patient and/or family made aware of any abnormal findings. ____________________________________________    PROCEDURES  Procedure(s) performed: None  Procedures  Critical Care performed: None  ____________________________________________   INITIAL IMPRESSION / ASSESSMENT AND  PLAN / ED COURSE  Pertinent labs & imaging results that were available during my care of the patient were reviewed by me and considered in my medical decision making (see chart for details).  Patient is here because her sugars are somewhat elevated she has absolutely no desire to be here, her sugars are likely elevated because of dietary noncompliance, apparently according to family she was given a large and sugary cupcake prior to having her glucose checked.  It is trending down here we will give her some IV fluid, patient otherwise appears to be at her baseline.  Appears to have a urinary tract infection.  Sensitivities to Macrobid and Rocephin are illustrated.  We will give Rocephin here as a precaution as infection could be driving up her sugar and we will discharge her home.    ----------------------------------------- 10:10 PM on 04/25/2017 -----------------------------------------  We attempted to ambulate the pt pt d/c as she lives in assisted living and must be able to walk. She cannot do it. She is too weak. This is similar to prior visits with uti.  Family does not feel she is safe for d/c.  I will ct her head as there is a question of fall and if negative I will admit her for further evaluation.     ____________________________________________   FINAL CLINICAL IMPRESSION(S) / ED DIAGNOSES  Final diagnoses:  None      This chart was dictated using voice recognition software.  Despite best efforts to proofread,  errors can occur which can change meaning.      Jeanmarie PlantMcShane, Nannie Starzyk A, MD 04/25/17 2018    Jeanmarie PlantMcShane, Davine Coba A, MD 04/25/17 2211

## 2017-04-25 NOTE — ED Notes (Signed)
Pt ambulates well to and from bathroom with stand by assistance.

## 2017-04-25 NOTE — ED Notes (Signed)
RN at bedside to stand pt and make sure pt is ambulatory to go back to assisted living. Pt did not follow commands to stand and was unable to move legs off the bed or sit up independently. RN and pt's daughter attempted to stand pt but had to lift pt off bed and both assists were needed to keep pt standing. Pt was unable to move legs under self to be stable on feet. Pt placed back in treatment bed and MD made aware. Family updated that pt will be admitted.

## 2017-04-25 NOTE — ED Notes (Signed)
Pt asking to get out of bed, sitting at end of bed. Asked by this RN to stay in bed. Side rails X 2, call bell within reach, bed alarm on patient. Pt continues to ask to go back to her room. Attempt to reorient patient to hospital.

## 2017-04-25 NOTE — ED Notes (Signed)
Pt walking down hallway in her clothing. Redirected back to room. Pt moved to room closer to nurses station. Family at bedside currently. Given drink. Call bell within reach. Bed alarm on patient.

## 2017-04-25 NOTE — ED Notes (Signed)
Pt assisted to and from bathroom and back to bed. Family at bedside.

## 2017-04-25 NOTE — ED Notes (Signed)
Patient transported to CT 

## 2017-04-25 NOTE — H&P (Signed)
[facility] - [place] at [facility]   PATIENT NAME: Glenda Vaughn    MR#:  [id number]  DATE OF BIRTH:  09-04-1940  DATE OF ADMISSION:  04/25/2017  PRIMARY CARE PHYSICIAN: Jaclyn Shaggy, MD   REQUESTING/REFERRING PHYSICIAN:   CHIEF COMPLAINT:   Chief Complaint  Patient presents with  . Hyperglycemia  . Fall    HISTORY OF PRESENT ILLNESS: Glenda Vaughn  is a 76 y.o. female with past medical history per below, status post recent hospital admission for sepsis due to multidrug-resistant E. coli infection, presented to the emergency room after unwitnessed fall and elevated blood sugar after eating a cup cake per her daughter, ER workup noted for blood sugar 361 with normal anion gap, CT head was negative for any acute process, white count 12, hemoglobin 9 which is stable, urinalysis suspicious for UTI, hospitalist consulted for further evaluation/care.  Patient evaluated at the bedside emergency room, daughter at the bedside, daughter states that she is unable to care for her as she lives alone in assisted living facility given her agitated/restless/mentally altered state, patient is now being admitted for acute encephalopathy most likely secondary to acute urinary tract infection.  PAST MEDICAL HISTORY:   Past Medical History:  Diagnosis Date  . Alzheimer's dementia   . Depression   . Diabetes mellitus without complication (HCC)   . Hyperlipidemia   . Hypertension   . Thyroid disease     PAST SURGICAL HISTORY: History reviewed. No pertinent surgical history.  SOCIAL HISTORY:  Social History   Tobacco Use  . Smoking status: Never Smoker  . Smokeless tobacco: Never Used  Substance Use Topics  . Alcohol use: No    FAMILY HISTORY:  Family History  Problem Relation Age of Onset  . Prostate cancer Father     DRUG ALLERGIES: No Known Allergies  REVIEW OF SYSTEMS: Unable to be obtained given dementia  CONSTITUTIONAL: No fever, fatigue or weakness.  EYES: No  blurred or double vision.  EARS, NOSE, AND THROAT: No tinnitus or ear pain.  RESPIRATORY: No cough, shortness of breath, wheezing or hemoptysis.  CARDIOVASCULAR: No chest pain, orthopnea, edema.  GASTROINTESTINAL: No nausea, vomiting, diarrhea or abdominal pain.  GENITOURINARY: No dysuria, hematuria.  ENDOCRINE: No polyuria, nocturia,  HEMATOLOGY: No anemia, easy bruising or bleeding SKIN: No rash or lesion. MUSCULOSKELETAL: No joint pain or arthritis.   NEUROLOGIC: No tingling, numbness, weakness.  PSYCHIATRY: No anxiety or depression.   MEDICATIONS AT HOME:  Prior to Admission medications   Medication Sig Start Date End Date Taking? Authorizing Provider  Ascorbic Acid (VITAMIN C) 500 MG CHEW Take 500 mg by mouth daily.   Yes [provider]  aspirin 81 MG chewable tablet Chew 81 mg by mouth daily.   Yes [provider]  atorvastatin (LIPITOR) 20 MG tablet Take 20 mg by mouth every evening.    Yes [provider]  Calcium Carbonate-Vitamin D3 (CALCIUM 600-D) 600-400 MG-UNIT TABS Take 1 tablet by mouth daily.   Yes [provider]  Diphenhydramine-APAP, sleep, (TYLENOL PM EXTRA STRENGTH PO) Take 1 tablet by mouth every evening.   Yes [provider]  donepezil (ARICEPT) 10 MG tablet Take 10 mg by mouth daily.   Yes [provider]  FLUoxetine (PROZAC) 20 MG capsule Take 20 mg by mouth daily.   Yes [provider]  glimepiride (AMARYL) 4 MG tablet Take 4 mg by mouth 2 (two) times daily.    Yes [provider]  levothyroxine (  SYNTHROID, LEVOTHROID) 125 MCG tablet Take 125 mcg by mouth every morning.    Yes [provider]  loperamide (IMODIUM) 2 MG capsule Take 2 mg by mouth as needed for diarrhea or loose stools.   Yes [provider]  memantine (NAMENDA) 10 MG tablet Take 10 mg by mouth 2 (two) times daily.   Yes [provider]  metFORMIN (GLUCOPHAGE-XR) 500 MG 24 hr tablet Take 500 mg by  mouth 2 (two) times daily.   Yes [provider]  Multiple Vitamins-Minerals (CERTAVITE SENIOR/ANTIOXIDANT) TABS Take 1 tablet by mouth daily.   Yes [provider]  naproxen sodium (ANAPROX) 220 MG tablet Take 220 mg by mouth 2 (two) times daily with a meal.   Yes [provider]  Probiotic Product (ALIGN) 4 MG CAPS Take 4 mg by mouth daily.   Yes [provider]  cephALEXin (KEFLEX) 500 MG capsule Take 1 capsule (500 mg total) by mouth every 12 (twelve) hours. Patient not taking: Reported on 04/25/2017 04/12/17   Enedina FinnerPatel, Sona, MD  Multiple Vitamin (MULTIVITAMIN) tablet Take 1 tablet by mouth daily.    [provider]  nitrofurantoin, macrocrystal-monohydrate, (MACROBID) 100 MG capsule Take 1 capsule (100 mg total) 2 (two) times daily for 7 days by mouth. 04/25/17 05/02/17  Jeanmarie PlantMcShane, James A, MD      PHYSICAL EXAMINATION:   VITAL SIGNS: Blood pressure (!) 145/69, pulse (!) 102, temperature 99.7 F (37.6 C), temperature source Oral, resp. rate 18, height 5\' 6"  (1.676 m), weight 65.8 kg (145 lb), SpO2 98 %.  GENERAL:  76 y.o.-year-old patient lying in the bed with no acute distress.  Nontoxic-appearing, restless, mild agitation noted EYES: Pupils equal, round, reactive to light and accommodation. No scleral icterus. Extraocular muscles intact.  HEENT: Head atraumatic, normocephalic. Oropharynx and nasopharynx clear.  NECK:  Supple, no jugular venous distention. No thyroid enlargement, no tenderness.  LUNGS: Normal breath sounds bilaterally, no wheezing, rales,rhonchi or crepitation. No use of accessory muscles of respiration.  CARDIOVASCULAR: S1, S2 normal. No murmurs, rubs, or gallops.  ABDOMEN: Soft, nontender, nondistended. Bowel sounds present. No organomegaly or mass.  EXTREMITIES: No pedal edema, cyanosis, or clubbing.  NEUROLOGIC: Cranial nerves II through XII are intact. MAES. Gait not checked.  PSYCHIATRIC: The patient is alert/awake,  confused/disoriented  SKIN: No obvious rash, lesion, or ulcer.   LABORATORY PANEL:   CBC Recent Labs  Lab 04/25/17 1822  WBC 12.9*  HGB 9.1*  HCT 28.6*  PLT 341  MCV 85.4  MCH 27.2  MCHC 31.8*  RDW 16.9*   ------------------------------------------------------------------------------------------------------------------  Chemistries  Recent Labs  Lab 04/25/17 1822  NA 135  K 3.9  CL 103  CO2 19*  GLUCOSE 361*  BUN 20  CREATININE 0.90  CALCIUM 8.4*   ------------------------------------------------------------------------------------------------------------------ estimated creatinine clearance is 50.6 mL/min (by C-G formula based on SCr of 0.9 mg/dL). ------------------------------------------------------------------------------------------------------------------ No results for input(s): TSH, T4TOTAL, T3FREE, THYROIDAB in the last 72 hours.  Invalid input(s): FREET3   Coagulation profile No results for input(s): INR, PROTIME in the last 168 hours. ------------------------------------------------------------------------------------------------------------------- No results for input(s): DDIMER in the last 72 hours. -------------------------------------------------------------------------------------------------------------------  Cardiac Enzymes No results for input(s): CKMB, TROPONINI, MYOGLOBIN in the last 168 hours.  Invalid input(s): CK ------------------------------------------------------------------------------------------------------------------ Invalid input(s): POCBNP  ---------------------------------------------------------------------------------------------------------------  Urinalysis    Component Value Date/Time   COLORURINE YELLOW (A) 04/25/2017 1838   APPEARANCEUR CLOUDY (A) 04/25/2017 1838   LABSPEC 1.022 04/25/2017 1838   PHURINE 5.0 04/25/2017 1838   GLUCOSEU >=  500 (A) 04/25/2017 1838   HGBUR SMALL (A) 04/25/2017 1838   BILIRUBINUR  NEGATIVE 04/25/2017 1838   KETONESUR 5 (A) 04/25/2017 1838   PROTEINUR NEGATIVE 04/25/2017 1838   NITRITE NEGATIVE 04/25/2017 1838   LEUKOCYTESUR LARGE (A) 04/25/2017 1838     RADIOLOGY: Ct Head Wo Contrast  Result Date: 04/25/2017 CLINICAL DATA:  Hyperglycemia and history of frequent falls. EXAM: CT HEAD WITHOUT CONTRAST TECHNIQUE: Contiguous axial images were obtained from the base of the skull through the vertex without intravenous contrast. COMPARISON:  04/04/2016 head CT FINDINGS: BRAIN: There is sulcal and ventricular prominence consistent with superficial and central atrophy. No intraparenchymal hemorrhage, mass effect nor midline shift. Periventricular and subcortical white matter hypodensities consistent with chronic small vessel ischemic disease are identified. No acute large vascular territory infarcts. No abnormal extra-axial fluid collections. Basal cisterns are not effaced and midline. VASCULAR: Moderate calcific atherosclerosis of the carotid siphons. SKULL: No skull fracture. No significant scalp soft tissue swelling. SINUSES/ORBITS: The mastoid air-cells are clear. The included paranasal sinuses are well-aerated.The included ocular globes and orbital contents are non-suspicious. OTHER: None. IMPRESSION: Atrophy with chronic small vessel ischemia. No acute intracranial abnormality. No significant change. Electronically Signed   By: Tollie Ethavid  Kwon M.D.   On: 04/25/2017 22:21    EKG: Orders placed or performed during the hospital encounter of 04/11/17  . EKG 12-Lead  . EKG 12-Lead  . EKG    IMPRESSION AND PLAN: 1 acute encephalopathy Most likely secondary to acute recurrent urinary tract infection, acute hyperglycemia, and chronic Alzheimer's disease Admit to regular nursing floor bed, neurochecks per routine, IV fluids for rehydration, empiric Rocephin for UTI, fall/aspiration precautions, and continue close medical monitoring CT head negative for any acute process  2 acute  urinary tract infection Status post recent hospital admission for sepsis due to multidrug-resistant E. coli infection-sensitivities noted Rocephin IV for 3-day course and follow-up on cultures  3 chronic Alzheimer's type dementia Continue psychotropic regimen  4 acute on chronic frequent falls Exacerbated by above  Fall precautions while in house  5 hyperglycemia w/ chronic DM type 2 Acutely elevated given recent eating of cupcake in the assisted living setting per daughter Continue home regimen, start SSI with Accu-Cheks per routine, IV fluids for rehydration, continue close medical monitoring  DNR status per daughter Condition stable Prognosis poor  DVT prophylaxis with Lovenox subcu Disposition back to assisted living facility in 1-2 days    All the records are reviewed and case discussed with ED provider. Management plans discussed with the patient, family and they are in agreement.  CODE STATUS: Code Status History    Date Active Date Inactive Code Status Order ID Comments User Context   04/11/2017 22:36 04/12/2017 16:50 DNR [id number]  Altamese DillingVachhani, Vaibhavkumar, MD Inpatient   04/04/2016 01:06 04/05/2016 16:02 DNR [id number]  Tonye RoyaltyHugelmeyer, Alexis, DO Inpatient    Questions for Most Recent Historical Code Status (Order [id number])    Question Answer Comment   In the event of cardiac or respiratory ARREST Do not call a "code blue"    In the event of cardiac or respiratory ARREST Do not perform Intubation, CPR, defibrillation or ACLS    In the event of cardiac or respiratory ARREST Use medication by any route, position, wound care, and other measures to relive pain and suffering. May use oxygen, suction and manual treatment of airway obstruction as needed for comfort.        TOTAL TIME TAKING CARE OF THIS PATIENT: 40 minutes.  Evelena Asa Salary M.D on 04/25/2017   Between 7am to 6pm - Pager - [phone]  After 6pm go to [email or web address] - password [email or web address]  [facility]  Office  [phone]  CC: Primary care physician; Jaclyn Shaggy, MD   Note: This dictation was prepared with Dragon dictation along with smaller phrase technology. Any transcriptional errors that result from this process are unintentional.

## 2017-04-26 ENCOUNTER — Other Ambulatory Visit: Payer: Self-pay

## 2017-04-26 LAB — GLUCOSE, CAPILLARY
GLUCOSE-CAPILLARY: 201 mg/dL — AB (ref 65–99)
Glucose-Capillary: 187 mg/dL — ABNORMAL HIGH (ref 65–99)
Glucose-Capillary: 145 mg/dL — ABNORMAL HIGH (ref 65–99)
Glucose-Capillary: 146 mg/dL — ABNORMAL HIGH (ref 65–99)
Glucose-Capillary: 106 mg/dL — ABNORMAL HIGH (ref 65–99)
Glucose-Capillary: 124 mg/dL — ABNORMAL HIGH (ref 65–99)

## 2017-04-26 MED ORDER — CALCIUM CARBONATE-VITAMIN D3 600-400 MG-UNIT PO TABS: 1.0000 | ORAL_TABLET | Freq: Every day | ORAL | Status: DC

## 2017-04-26 MED ORDER — SODIUM CHLORIDE 0.9% FLUSH
3.0000 mL | Freq: Two times a day (BID) | INTRAVENOUS | Status: DC
  Administered 2017-04-26 – 2017-04-28 (×5): 3 mL via INTRAVENOUS

## 2017-04-26 MED ORDER — ONDANSETRON HCL 4 MG/2ML IJ SOLN: 4.0000 mg | Freq: Four times a day (QID) | INTRAMUSCULAR | Status: DC | PRN

## 2017-04-26 MED ORDER — DEXTROSE 5 % IV SOLN
1.0000 g | Freq: Every day | INTRAVENOUS | Status: AC
  Administered 2017-04-26 – 2017-04-28 (×3): 1 g via INTRAVENOUS
  Filled 2017-04-26 (×3): qty 10

## 2017-04-26 MED ORDER — DIPHENHYDRAMINE-APAP (SLEEP) 25-500 MG PO TABS: 1.0000 | ORAL_TABLET | Freq: Every evening | ORAL | Status: DC | PRN

## 2017-04-26 MED ORDER — DONEPEZIL HCL 5 MG PO TABS
10.0000 mg | ORAL_TABLET | Freq: Every day | ORAL | Status: DC
  Administered 2017-04-26 – 2017-04-28 (×3): 10 mg via ORAL
  Filled 2017-04-26 (×3): qty 2

## 2017-04-26 MED ORDER — DIPHENHYDRAMINE HCL 25 MG PO CAPS
25.0000 mg | ORAL_CAPSULE | Freq: Every evening | ORAL | Status: DC | PRN
  Administered 2017-04-26 – 2017-04-27 (×2): 25 mg via ORAL
  Filled 2017-04-26 (×2): qty 1

## 2017-04-26 MED ORDER — LOPERAMIDE HCL 2 MG PO CAPS: 2.0000 mg | ORAL_CAPSULE | ORAL | Status: DC | PRN

## 2017-04-26 MED ORDER — GLIMEPIRIDE 2 MG PO TABS
4.0000 mg | ORAL_TABLET | Freq: Two times a day (BID) | ORAL | Status: DC
  Administered 2017-04-26 – 2017-04-28 (×4): 4 mg via ORAL
  Filled 2017-04-26: qty 2
  Filled 2017-04-26: qty 1
  Filled 2017-04-26 (×3): qty 2
  Filled 2017-04-26: qty 1
  Filled 2017-04-26: qty 2

## 2017-04-26 MED ORDER — ADULT MULTIVITAMIN W/MINERALS CH
1.0000 | ORAL_TABLET | Freq: Every day | ORAL | Status: DC
  Administered 2017-04-26 – 2017-04-28 (×3): 1 via ORAL
  Filled 2017-04-26 (×3): qty 1

## 2017-04-26 MED ORDER — ASPIRIN 81 MG PO CHEW
81.0000 mg | CHEWABLE_TABLET | Freq: Every day | ORAL | Status: DC
  Administered 2017-04-26 – 2017-04-28 (×3): 81 mg via ORAL
  Filled 2017-04-26 (×3): qty 1

## 2017-04-26 MED ORDER — POLYETHYLENE GLYCOL 3350 17 G PO PACK: 17.0000 g | PACK | Freq: Every day | ORAL | Status: DC | PRN

## 2017-04-26 MED ORDER — ALIGN 4 MG PO CAPS: 4.0000 mg | ORAL_CAPSULE | Freq: Every day | ORAL | Status: DC

## 2017-04-26 MED ORDER — ACETAMINOPHEN 650 MG RE SUPP
650.0000 mg | Freq: Four times a day (QID) | RECTAL | Status: DC | PRN
Start: 2017-04-26 — End: 2017-04-28
  Administered 2017-04-26: 650 mg via RECTAL
  Filled 2017-04-26: qty 1

## 2017-04-26 MED ORDER — ATORVASTATIN CALCIUM 20 MG PO TABS
20.0000 mg | ORAL_TABLET | Freq: Every evening | ORAL | Status: DC
  Administered 2017-04-26 – 2017-04-27 (×2): 20 mg via ORAL
  Filled 2017-04-26 (×3): qty 1

## 2017-04-26 MED ORDER — ENOXAPARIN SODIUM 40 MG/0.4ML ~~LOC~~ SOLN
40.0000 mg | SUBCUTANEOUS | Status: DC
  Administered 2017-04-26 – 2017-04-28 (×3): 40 mg via SUBCUTANEOUS
  Filled 2017-04-26 (×3): qty 0.4

## 2017-04-26 MED ORDER — VITAMIN C 500 MG PO TABS
500.0000 mg | ORAL_TABLET | Freq: Every day | ORAL | Status: DC
  Administered 2017-04-26 – 2017-04-28 (×3): 500 mg via ORAL
  Filled 2017-04-26 (×3): qty 1

## 2017-04-26 MED ORDER — RISAQUAD PO CAPS
1.0000 | ORAL_CAPSULE | Freq: Every day | ORAL | Status: DC
  Administered 2017-04-26 – 2017-04-28 (×3): 1 via ORAL
  Filled 2017-04-26 (×3): qty 1

## 2017-04-26 MED ORDER — ONDANSETRON HCL 4 MG PO TABS: 4.0000 mg | ORAL_TABLET | Freq: Four times a day (QID) | ORAL | Status: DC | PRN

## 2017-04-26 MED ORDER — FLUOXETINE HCL 20 MG PO CAPS
20.0000 mg | ORAL_CAPSULE | Freq: Every day | ORAL | Status: DC
  Administered 2017-04-26 – 2017-04-28 (×3): 20 mg via ORAL
  Filled 2017-04-26 (×3): qty 1

## 2017-04-26 MED ORDER — METFORMIN HCL ER 500 MG PO TB24
500.0000 mg | ORAL_TABLET | Freq: Two times a day (BID) | ORAL | Status: DC
  Administered 2017-04-26 – 2017-04-28 (×5): 500 mg via ORAL
  Filled 2017-04-26 (×6): qty 1

## 2017-04-26 MED ORDER — ACETAMINOPHEN 500 MG PO TABS: 500.0000 mg | ORAL_TABLET | Freq: Every evening | ORAL | Status: DC | PRN

## 2017-04-26 MED ORDER — MEMANTINE HCL 5 MG PO TABS
10.0000 mg | ORAL_TABLET | Freq: Two times a day (BID) | ORAL | Status: DC
  Administered 2017-04-26 – 2017-04-28 (×5): 10 mg via ORAL
  Filled 2017-04-26 (×6): qty 2

## 2017-04-26 MED ORDER — SODIUM CHLORIDE 0.9 % IV SOLN
INTRAVENOUS | Status: AC
  Administered 2017-04-26: 02:00:00 via INTRAVENOUS

## 2017-04-26 MED ORDER — ACETAMINOPHEN 325 MG PO TABS
650.0000 mg | ORAL_TABLET | Freq: Four times a day (QID) | ORAL | Status: DC | PRN
  Administered 2017-04-28: 650 mg via ORAL
  Filled 2017-04-26: qty 2

## 2017-04-26 MED ORDER — HYDROCODONE-ACETAMINOPHEN 5-325 MG PO TABS: 1.0000 | ORAL_TABLET | ORAL | Status: DC | PRN

## 2017-04-26 MED ORDER — LEVOTHYROXINE SODIUM 50 MCG PO TABS
125.0000 ug | ORAL_TABLET | Freq: Every day | ORAL | Status: DC
  Administered 2017-04-26 – 2017-04-28 (×3): 125 ug via ORAL
  Filled 2017-04-26 (×3): qty 3

## 2017-04-26 MED ORDER — LORAZEPAM 2 MG/ML IJ SOLN
1.0000 mg | Freq: Once | INTRAMUSCULAR | Status: AC
  Administered 2017-04-26: 1 mg via INTRAVENOUS
  Filled 2017-04-26 (×2): qty 1

## 2017-04-26 MED ORDER — CALCIUM CARBONATE-VITAMIN D 500-200 MG-UNIT PO TABS
1.0000 | ORAL_TABLET | Freq: Every day | ORAL | Status: DC
  Administered 2017-04-26 – 2017-04-28 (×3): 1 via ORAL
  Filled 2017-04-26 (×3): qty 1

## 2017-04-26 NOTE — ED Notes (Signed)
Pt was assisted to the restroom by this EDT and family. Pt stable and in no sign of distress at this time. Pt dry and back in bed with clean brief

## 2017-04-26 NOTE — Progress Notes (Signed)
Pt is only alert to self. Refuses to remove any clothes but did allow skin assessment. Pt keeps trying to get up to use restroom/leave. Sitter at bedside. Pt has unsteady gait and is 2 assist to bedside commode. Will continue to monitor.

## 2017-04-26 NOTE — NC FL2 (Signed)
[place] MEDICAID FL2 LEVEL OF CARE SCREENING TOOL     IDENTIFICATION  Patient Name: Glenda AlexanderCarolyn L Vaughn Birthdate: [birth year] Sex: female Admission Date (Current Location): 04/25/2017  [facility] [place]   Facility and Address:  [facility], [address], [place], [place] [address]      Provider Number: [id number]  Attending Physician Name and Address:  Milagros LollSudini, Srikar, MD  Relative Name and Phone Number:       Current Level of Care: Hospital Recommended Level of Care: Assisted Living Facility, Memory Care Prior Approval Number:    Date Approved/Denied:   PASRR Number:    Discharge Plan: Domiciliary (Rest home)(Memory Care )    Current Diagnoses: Primary: Alzheimer's dementia Patient Active Problem List   Diagnosis Date Noted  . Encephalopathy acute 04/25/2017  . Sepsis (HCC) 04/11/2017  . Elevated troponin 04/04/2016  . Fall 04/04/2016  . Alzheimer's dementia 04/04/2016  . Generalized weakness 04/04/2016  . Acute cystitis without hematuria 04/04/2016  . Iron deficiency anemia 04/04/2016  . Hyponatremia 04/04/2016  . Mild aortic stenosis 04/04/2016  . Urinary tract infection with hematuria     Orientation RESPIRATION BLADDER Height & Weight     Self  Normal Incontinent Weight: 145 lb (65.8 kg) Height:  5\' 6"  (167.6 cm)  BEHAVIORAL SYMPTOMS/MOOD NEUROLOGICAL BOWEL NUTRITION STATUS      Continent Diet(Diet: Heart Healthy )  AMBULATORY STATUS COMMUNICATION OF NEEDS Skin   Limited Assist Verbally Normal                       Personal Care Assistance Level of Assistance  Bathing, Feeding, Dressing Bathing Assistance: Independent Feeding assistance: Independent Dressing Assistance: Independent     Functional Limitations Info  Sight, Hearing, Speech Sight Info: Adequate Hearing Info: Adequate Speech Info: Adequate    SPECIAL CARE FACTORS FREQUENCY  PT (By licensed PT)     PT Frequency: (home health PT  )              Contractures      Additional Factors Info  Code Status, Allergies Code Status Info: (DNR ) Allergies Info: (No Known Allergies. )           Current Medications (04/26/2017):  This is the current hospital active medication list Current Facility-Administered Medications  Medication Dose Route Frequency Provider Last Rate Last Dose  . 0.9 %  sodium chloride infusion   Intravenous Continuous Salary, Montell D, MD 75 mL/hr at 04/26/17 0206    . acetaminophen (TYLENOL) tablet 650 mg  650 mg Oral Q6H PRN Salary, Montell D, MD       Or  . acetaminophen (TYLENOL) suppository 650 mg  650 mg Rectal Q6H PRN Salary, Montell D, MD   650 mg at 04/26/17 0032  . diphenhydrAMINE (BENADRYL) capsule 25 mg  25 mg Oral QHS PRN Salary, Jetty DuhamelMontell D, MD   25 mg at 04/26/17 0212   And  . acetaminophen (TYLENOL) tablet 500 mg  500 mg Oral QHS PRN Salary, Montell D, MD      . acidophilus (RISAQUAD) capsule 1 capsule  1 capsule Oral Daily Salary, Montell D, MD   1 capsule at 04/26/17 1039  . aspirin chewable tablet 81 mg  81 mg Oral Daily Salary, Montell D, MD   81 mg at 04/26/17 1038  . atorvastatin (LIPITOR) tablet 20 mg  20 mg Oral QPM Salary, Evelena AsaMontell D, MD      . calcium-vitamin D (OSCAL  WITH D) 500-200 MG-UNIT per tablet 1 tablet  1 tablet Oral Q breakfast Salary, Evelena AsaMontell D, MD   1 tablet at 04/26/17 [phone]  . cefTRIAXone (ROCEPHIN) 1 g in dextrose 5 % 50 mL IVPB  1 g Intravenous Daily Milagros LollSudini, Srikar, MD   Stopped at 04/26/17 1158  . donepezil (ARICEPT) tablet 10 mg  10 mg Oral Daily Salary, Montell D, MD   10 mg at 04/26/17 1037  . enoxaparin (LOVENOX) injection 40 mg  40 mg Subcutaneous Q24H Salary, Montell D, MD   40 mg at 04/26/17 0156  . FLUoxetine (PROZAC) capsule 20 mg  20 mg Oral Daily Salary, Montell D, MD   20 mg at 04/26/17 1039  . glimepiride (AMARYL) tablet 4 mg  4 mg Oral BID Salary, Montell D, MD   4 mg at 04/26/17 1038  . HYDROcodone-acetaminophen (NORCO/VICODIN) 5-325 MG per  tablet 1-2 tablet  1-2 tablet Oral Q4H PRN Salary, Montell D, MD      . insulin aspart (novoLOG) injection 0-15 Units  0-15 Units Subcutaneous TID WC Salary, Montell D, MD   2 Units at 04/26/17 1330  . insulin aspart (novoLOG) injection 0-5 Units  0-5 Units Subcutaneous QHS Angelina OkSalary, Montell D, MD   2 Units at 04/26/17 0032  . levothyroxine (SYNTHROID, LEVOTHROID) tablet 125 mcg  125 mcg Oral QAC breakfast Angelina OkSalary, Montell D, MD   125 mcg at 04/26/17 0835  . loperamide (IMODIUM) capsule 2 mg  2 mg Oral PRN Salary, Montell D, MD      . LORazepam (ATIVAN) injection 1 mg  1 mg Intravenous Once Ihor AustinPyreddy, Pavan, MD   Stopped at 04/26/17 0447  . memantine (NAMENDA) tablet 10 mg  10 mg Oral BID Salary, Jetty DuhamelMontell D, MD   10 mg at 04/26/17 1038  . metFORMIN (GLUCOPHAGE-XR) 24 hr tablet 500 mg  500 mg Oral BID AC Salary, Montell D, MD   500 mg at 04/26/17 0835  . multivitamin with minerals tablet 1 tablet  1 tablet Oral Daily Salary, Montell D, MD   1 tablet at 04/26/17 1037  . ondansetron (ZOFRAN) tablet 4 mg  4 mg Oral Q6H PRN Salary, Montell D, MD       Or  . ondansetron (ZOFRAN) injection 4 mg  4 mg Intravenous Q6H PRN Salary, Montell D, MD      . polyethylene glycol (MIRALAX / GLYCOLAX) packet 17 g  17 g Oral Daily PRN Salary, Montell D, MD      . sodium chloride flush (NS) 0.9 % injection 3 mL  3 mL Intravenous Q12H Salary, Montell D, MD   3 mL at 04/26/17 1051  . vitamin C (ASCORBIC ACID) tablet 500 mg  500 mg Oral Daily Salary, Montell D, MD   500 mg at 04/26/17 1039     Discharge Medications: Current Discharge Medication List        START taking these medications   Details  ferrous sulfate 325 (65 FE) MG tablet Take 1 tablet (325 mg total) daily with breakfast by mouth. Qty: 30 tablet, Refills: 0          CONTINUE these medications which have CHANGED   Details  cephALEXin (KEFLEX) 500 MG capsule Take 1 capsule (500 mg total) every 6 (six) hours by mouth. Qty: 28 capsule, Refills: 0           CONTINUE these medications which have NOT CHANGED   Details  Ascorbic Acid (VITAMIN C) 500 MG CHEW Take 500 mg by mouth  daily.    aspirin 81 MG chewable tablet Chew 81 mg by mouth daily.    Calcium Carbonate-Vitamin D3 (CALCIUM 600-D) 600-400 MG-UNIT TABS Take 1 tablet by mouth daily.    Diphenhydramine-APAP, sleep, (TYLENOL PM EXTRA STRENGTH PO) Take 1 tablet by mouth every evening.    donepezil (ARICEPT) 10 MG tablet Take 10 mg by mouth daily.    FLUoxetine (PROZAC) 20 MG capsule Take 20 mg by mouth daily.    glimepiride (AMARYL) 4 MG tablet Take 4 mg by mouth 2 (two) times daily.     levothyroxine (SYNTHROID, LEVOTHROID) 125 MCG tablet Take 125 mcg by mouth every morning.     loperamide (IMODIUM) 2 MG capsule Take 2 mg by mouth as needed for diarrhea or loose stools.    memantine (NAMENDA) 10 MG tablet Take 10 mg by mouth 2 (two) times daily.    metFORMIN (GLUCOPHAGE-XR) 500 MG 24 hr tablet Take 500 mg by mouth 2 (two) times daily.    Multiple Vitamins-Minerals (CERTAVITE SENIOR/ANTIOXIDANT) TABS Take 1 tablet by mouth daily.    naproxen sodium (ANAPROX) 220 MG tablet Take 220 mg by mouth 2 (two) times daily with a meal.    Probiotic Product (ALIGN) 4 MG CAPS Take 4 mg by mouth daily.    Multiple Vitamin (MULTIVITAMIN) tablet Take 1 tablet by mouth daily.         STOP taking these medications     atorvastatin (LIPITOR) 20 MG tablet          Relevant Imaging Results:  Relevant Lab Results:   Additional Information (SSN: [id number])  Sample, Darleen Crocker, LCSW

## 2017-04-26 NOTE — Progress Notes (Signed)
[facility] - [place] at [facility]   PATIENT NAME: Glenda PartridgeCarolyn Vaughn    MR#:  [id number]  DATE OF BIRTH:  Aug 25, 1940  SUBJECTIVE:  CHIEF COMPLAINT:   Chief Complaint  Patient presents with  . Hyperglycemia  . Fall   Patient laying in bed.  Confused.  Daughter at bedside.  Afebrile. Not agitated. slowly improving according to daughter.  REVIEW OF SYSTEMS:    Review of Systems  Unable to perform ROS: Dementia    DRUG ALLERGIES:  No Known Allergies  VITALS:  Blood pressure (!) 122/58, pulse 88, temperature 99 F (37.2 C), temperature source Oral, resp. rate 19, height 5\' 6"  (1.676 m), weight 65.8 kg (145 lb), SpO2 98 %.  PHYSICAL EXAMINATION:   Physical Exam  GENERAL:  76 y.o.-year-old patient lying in the bed with no acute distress.  EYES: Pupils equal, round, reactive to light and accommodation. No scleral icterus. Extraocular muscles intact.  HEENT: Head atraumatic, normocephalic. Oropharynx and nasopharynx clear.  NECK:  Supple, no jugular venous distention. No thyroid enlargement, no tenderness.  LUNGS: Normal breath sounds bilaterally, no wheezing, rales, rhonchi. No use of accessory muscles of respiration.  CARDIOVASCULAR: S1, S2 normal. No murmurs, rubs, or gallops.  ABDOMEN: Soft, nontender, nondistended. Bowel sounds present. No organomegaly or mass.  EXTREMITIES: No cyanosis, clubbing or edema b/l.    NEUROLOGIC: Cranial nerves II through XII are intact. No focal Motor or sensory deficits b/l.   PSYCHIATRIC: The patient is alert and awake SKIN: No obvious rash, lesion, or ulcer.   LABORATORY PANEL:   CBC Recent Labs  Lab 04/25/17 1822  WBC 12.9*  HGB 9.1*  HCT 28.6*  PLT 341   ------------------------------------------------------------------------------------------------------------------ Chemistries  Recent Labs  Lab 04/25/17 1822  NA 135  K 3.9  CL 103  CO2 19*  GLUCOSE 361*  BUN 20  CREATININE 0.90  CALCIUM 8.4*    ------------------------------------------------------------------------------------------------------------------  Cardiac Enzymes No results for input(s): TROPONINI in the last 168 hours. ------------------------------------------------------------------------------------------------------------------  RADIOLOGY:  Ct Head Wo Contrast  Result Date: 04/25/2017 CLINICAL DATA:  Hyperglycemia and history of frequent falls. EXAM: CT HEAD WITHOUT CONTRAST TECHNIQUE: Contiguous axial images were obtained from the base of the skull through the vertex without intravenous contrast. COMPARISON:  04/04/2016 head CT FINDINGS: BRAIN: There is sulcal and ventricular prominence consistent with superficial and central atrophy. No intraparenchymal hemorrhage, mass effect nor midline shift. Periventricular and subcortical white matter hypodensities consistent with chronic small vessel ischemic disease are identified. No acute large vascular territory infarcts. No abnormal extra-axial fluid collections. Basal cisterns are not effaced and midline. VASCULAR: Moderate calcific atherosclerosis of the carotid siphons. SKULL: No skull fracture. No significant scalp soft tissue swelling. SINUSES/ORBITS: The mastoid air-cells are clear. The included paranasal sinuses are well-aerated.The included ocular globes and orbital contents are non-suspicious. OTHER: None. IMPRESSION: Atrophy with chronic small vessel ischemia. No acute intracranial abnormality. No significant change. Electronically Signed   By: Tollie Ethavid  Kwon M.D.   On: 04/25/2017 22:21     ASSESSMENT AND PLAN:   *UTI with acute encephalopathy or baseline dementia On IV ceftriaxone.  Improving.  Stop IV fluids. Monitor for inpatient delirium. Failed treatment with recent oral antibiotics.  Wait for cultures.  *Recurrent falls.  Physical therapy consulted. Plan is to discharge back to assisted living versus skilled nursing facility.  *Hyperglycemia with  diabetes mellitus type 2.  Due to high carb food prior to admission.  Improved.  Sliding scale insulin.  *Anemia of chronic disease is  stable  *DVT prophylaxis with Lovenox   All the records are reviewed and case discussed with Care Management/Social Workerr. Management plans discussed with the patient, family and they are in agreement.  CODE STATUS: DNR  DVT Prophylaxis: SCDs  TOTAL TIME TAKING CARE OF THIS PATIENT: 35 minutes.   POSSIBLE D/C IN 1-2 DAYS, DEPENDING ON CLINICAL CONDITION.  Milagros LollSudini, Johnny Latu R M.D on 04/26/2017 at 12:58 PM  Between 7am to 6pm - Pager - [phone]  After 6pm go to [email or web address] - password [email or web address] [facility]  [facility] [place] [facility]  Office  [phone]  CC: Primary care physician; Jaclyn Shaggyate, Denny C, MD  Note: This dictation was prepared with Dragon dictation along with smaller phrase technology. Any transcriptional errors that result from this process are unintentional.

## 2017-04-26 NOTE — ED Notes (Signed)
Pt was assisted to restroom by this EDT and RN Carollee HerterShannon, pt is dry and in bed.

## 2017-04-26 NOTE — Clinical Social Work Note (Signed)
Clinical Social Work Assessment  Patient Details  Name: Glenda Vaughn MRN: [id number] Date of Birth: [birth year]  Date of referral:  04/26/17               Reason for consult:  Other (Comment Required)(From [name] )                Permission sought to share information with:  Chartered certified accountant granted to share information::  Yes, Verbal Permission Granted  Name::      [name]   Agency::     Relationship::     Contact Information:     Housing/Transportation Living arrangements for the past 2 months:  [place] of Information:  Adult Children, Facility Patient Interpreter Needed:  None Criminal Activity/Legal Involvement Pertinent to Current Situation/Hospitalization:  No - Comment as needed Significant Relationships:  Adult Children Lives with:  Facility Resident Do you feel safe going back to the place where you live?  Yes Need for family participation in patient care:  Yes (Comment)  Care giving concerns:  Patient is a resident at [facility] memory care unit (fax: [phone]).    Social Worker assessment / plan:  Holiday representative (CSW) reviewed chart and noted that patient is from [place]. CSW met with patient and her daughter/ HPOA Baxter Flattery [phone] was at bedside. Patient was asleep and did not participate in assessment. CSW introduced self and explained role of CSW department. Per daughter patient lives on the second floor memory care unit at [facility] and has been there for 3 years. Per daughter patient is privately paying for [facility] [employer] [facility] and doesn't have medicaid. Per daughter patient goes to the day program at [facility] several days per week. Per Baxter Flattery patient has a recurring UTI that makes her feel awful. Baxter Flattery is agreeable for patient to return to [facility]. Per Baxter Flattery her brother Harrie Jeans [phone] will transport patient back to [facility] [employer] [facility] this weekend if needed. Per Baxter Flattery she  is going out of town this weekend.  CSW contacted [facility] [employer] [facility] and spoke to med [employer]. Per med tech patient is independent with ambulation and does not use a walker. Per med tech patient is on room air and has home health through [facility]. Per med tech patient can return to [facility] when stable. CSW will continue to follow and assist as needed.   Employment status:  Retired Nurse, adult PT Recommendations:  Not assessed at this time Information / Referral to community resources:  Other (Comment Required)(Patient will retrun to [place] )  Patient/Family's Response to care:  Patient's daughter is agreeable for patient to return to [facility].   Patient/Family's Understanding of and Emotional Response to Diagnosis, Current Treatment, and Prognosis:  Patient's daughter was very pleasant and thanked CSW for visit.   Emotional Assessment Appearance:  Appears stated age Attitude/Demeanor/Rapport:  Unable to Assess Affect (typically observed):  Unable to Assess Orientation:  Oriented to Self, Fluctuating Orientation (Suspected and/or reported Sundowners) Alcohol / Substance use:  Not Applicable Psych involvement (Current and /or in the community):  No (Comment)  Discharge Needs  Concerns to be addressed:  Discharge Planning Concerns Readmission within the last 30 days:  Yes Current discharge risk:  Chronically ill Barriers to Discharge:  Continued Medical Work up   [employer], Veronia Beets, LCSW 04/26/2017, 3:09 PM

## 2017-04-26 NOTE — Progress Notes (Signed)
Pt is now dozing off. Will hold Ativan.

## 2017-04-26 NOTE — Care Management (Addendum)
RNCM notified by CSW that patient is followed by [facility].  I have left message for Maralyn SagoSarah with [facility].

## 2017-04-26 NOTE — Progress Notes (Signed)
Pt continues to be impulsive and not follow commands. Trying to remove IV and get up to "go down the hall." Pt is very unsteady on her feet and is not safe to ambulate at this time. MD notified and new order received.

## 2017-04-27 LAB — CBC WITH DIFFERENTIAL/PLATELET
BASOS ABS: 0 10*3/uL (ref 0–0.1)
Basophils Relative: 0 %
EOS PCT: 0 %
Eosinophils Absolute: 0 10*3/uL (ref 0–0.7)
HCT: 28.1 % — ABNORMAL LOW (ref 35.0–47.0)
Hemoglobin: 9.4 g/dL — ABNORMAL LOW (ref 12.0–16.0)
LYMPHS PCT: 13 %
Lymphs Abs: 1.8 10*3/uL (ref 1.0–3.6)
MCH: 27.7 pg (ref 26.0–34.0)
MCHC: 33.4 g/dL (ref 32.0–36.0)
MCV: 83 fL (ref 80.0–100.0)
Monocytes Absolute: 0.7 10*3/uL (ref 0.2–0.9)
Monocytes Relative: 5 %
NEUTROS ABS: 11 10*3/uL — AB (ref 1.4–6.5)
Neutrophils Relative %: 82 %
PLATELETS: 376 10*3/uL (ref 150–440)
RBC: 3.39 MIL/uL — AB (ref 3.80–5.20)
RDW: 16.5 % — ABNORMAL HIGH (ref 11.5–14.5)
WBC: 13.6 10*3/uL — AB (ref 3.6–11.0)

## 2017-04-27 LAB — GLUCOSE, CAPILLARY
GLUCOSE-CAPILLARY: 172 mg/dL — AB (ref 65–99)
GLUCOSE-CAPILLARY: 110 mg/dL — AB (ref 65–99)
Glucose-Capillary: 86 mg/dL (ref 65–99)
Glucose-Capillary: 134 mg/dL — ABNORMAL HIGH (ref 65–99)

## 2017-04-27 LAB — MRSA PCR SCREENING: MRSA BY PCR: NEGATIVE

## 2017-04-27 MED ORDER — QUETIAPINE FUMARATE 25 MG PO TABS
12.5000 mg | ORAL_TABLET | Freq: Every day | ORAL | Status: DC
  Administered 2017-04-27: 12.5 mg via ORAL
  Filled 2017-04-27: qty 1

## 2017-04-27 NOTE — Progress Notes (Signed)
Pt is very drowsy, was given Ativan IV by previous shift RN for agitation and impulsiveness. Pt responds to voice but drifts back to sleep. Writer attempted to give scheduled night time medicines but pt is too drowsy to swallow. Safety sitter at bedside. Will continue to monitor.

## 2017-04-27 NOTE — Progress Notes (Signed)
Pt alert but confused. Resting quietly in room. Safety sitter at bedside. Medications given to patient and tolerated well. Pt on room air. IV saline locked. No complaints at this time. Will continue to monitor

## 2017-04-27 NOTE — Progress Notes (Signed)
Patient ID: Glenda Vaughn, female   DOB: 03-Aug-1940, 76 y.o.   MRN: [id number]  [employer] PROGRESS NOTE  Glenda AlexanderCarolyn L Vaughn WUJ:[id number] DOB: 03-Aug-1940 DOA: 04/25/2017 PCP: Jaclyn Shaggyate, Denny C, MD  HPI/Subjective: Patient feels a little bit tired but feels okay.  Does not have any urinary symptoms.  [facility].  Objective: Vitals:   04/27/17 0031 04/27/17 0744  BP: (!) 143/64 130/63  Pulse: 95 94  Resp: 18 16  Temp: 99.2 F (37.3 C) 98.2 F (36.8 C)  SpO2: 100% 99%    Intake/Output Summary (Last 24 hours) at 04/27/2017 0932 Last data filed at 04/27/2017 0105 Gross per 24 hour  Intake 995 ml  Output -  Net 995 ml   Filed Weights   04/25/17 1810  Weight: 65.8 kg (145 lb)    ROS: Review of Systems  Unable to perform ROS: Dementia  Constitutional: Positive for malaise/fatigue.  Respiratory: Negative for shortness of breath.   Cardiovascular: Negative for chest pain.  Gastrointestinal: Negative for abdominal pain, nausea and vomiting.  Genitourinary: Negative for dysuria.   Exam: Physical Exam  HENT:  Nose: No mucosal edema.  Mouth/Throat: No oropharyngeal exudate or posterior oropharyngeal edema.  Eyes: Conjunctivae and lids are normal. Pupils are equal, round, and reactive to light.  Neck: No JVD present. Carotid bruit is not present. No edema present. No thyroid mass and no thyromegaly present.  Cardiovascular: S1 normal and S2 normal. Exam reveals no gallop.  No murmur heard. Pulses:      Dorsalis pedis pulses are 2+ on the right side, and 2+ on the left side.  Respiratory: No respiratory distress. She has no wheezes. She has no rhonchi. She has no rales.  GI: Soft. Bowel sounds are normal. There is no tenderness.  Musculoskeletal:       Right ankle: She exhibits no swelling.       Left ankle: She exhibits no swelling.  Lymphadenopathy:    She has no cervical adenopathy.  Neurological: She is alert. No cranial nerve  deficit.  Able to straight leg raise without a problem  Skin: Skin is warm. No rash noted. Nails show no clubbing.  Psychiatric: She has a normal mood and affect.      Data Reviewed: Basic Metabolic Panel: Recent Labs  Lab 04/25/17 1822  NA 135  K 3.9  CL 103  CO2 19*  GLUCOSE 361*  BUN 20  CREATININE 0.90  CALCIUM 8.4*   CBC: Recent Labs  Lab 04/25/17 1822 04/27/17 0404  WBC 12.9* 13.6*  NEUTROABS  --  11.0*  HGB 9.1* 9.4*  HCT 28.6* 28.1*  MCV 85.4 83.0  PLT 341 376    CBG: Recent Labs  Lab 04/26/17 1141 04/26/17 1324 04/26/17 1644 04/26/17 2214 04/27/17 0747  GLUCAP 145* 146* 106* 124* 172*    Recent Results (from the past 240 hour(s))  Urine culture     Status: Abnormal (Preliminary result)   Collection Time: 04/25/17  6:38 PM  Result Value Ref Range Status   Specimen Description URINE, RANDOM  Final   Special Requests NONE  Final   Culture >=100,000 COLONIES/mL GRAM NEGATIVE RODS (A)  Final   Report Status PENDING  Incomplete     Studies: Ct Head Wo Contrast  Result Date: 04/25/2017 CLINICAL DATA:  Hyperglycemia and history of frequent falls. EXAM: CT HEAD WITHOUT CONTRAST TECHNIQUE: Contiguous axial images were obtained from the base of the skull through the vertex without  intravenous contrast. COMPARISON:  04/04/2016 head CT FINDINGS: BRAIN: There is sulcal and ventricular prominence consistent with superficial and central atrophy. No intraparenchymal hemorrhage, mass effect nor midline shift. Periventricular and subcortical white matter hypodensities consistent with chronic small vessel ischemic disease are identified. No acute large vascular territory infarcts. No abnormal extra-axial fluid collections. Basal cisterns are not effaced and midline. VASCULAR: Moderate calcific atherosclerosis of the carotid siphons. SKULL: No skull fracture. No significant scalp soft tissue swelling. SINUSES/ORBITS: The mastoid air-cells are clear. The included  paranasal sinuses are well-aerated.The included ocular globes and orbital contents are non-suspicious. OTHER: None. IMPRESSION: Atrophy with chronic small vessel ischemia. No acute intracranial abnormality. No significant change. Electronically Signed   By: Tollie Ethavid  Kwon M.D.   On: 04/25/2017 22:21    Scheduled Meds: . acidophilus  1 capsule Oral Daily  . aspirin  81 mg Oral Daily  . atorvastatin  20 mg Oral QPM  . calcium-vitamin D  1 tablet Oral Q breakfast  . donepezil  10 mg Oral Daily  . enoxaparin (LOVENOX) injection  40 mg Subcutaneous Q24H  . FLUoxetine  20 mg Oral Daily  . glimepiride  4 mg Oral BID  . insulin aspart  0-15 Units Subcutaneous TID WC  . insulin aspart  0-5 Units Subcutaneous QHS  . levothyroxine  125 mcg Oral QAC breakfast  . memantine  10 mg Oral BID  . metFORMIN  500 mg Oral BID AC  . multivitamin with minerals  1 tablet Oral Daily  . sodium chloride flush  3 mL Intravenous Q12H  . vitamin C  500 mg Oral Daily   Continuous Infusions: . cefTRIAXone (ROCEPHIN)  IV Stopped (04/26/17 1158)    Assessment/Plan:  1. Acute cystitis with hematuria with acute encephalopathy with underlying dementia.  Still awaiting urine culture results will have sensitivities tomorrow.  Patient on IV ceftriaxone.  Last urine culture did show E. coli which had some resistance to it.  Will need to stay in the hospital until sensitivities are back. 2. Dementia with behavioral disturbance.  Start low-dose Seroquel at night.  On Namenda 3. Weakness and falls.  Physical therapy evaluation 4. Type 2 diabetes mellitus on metformin and glimepiride 5. Hyperlipidemia unspecified on atorvastatin 6. Hypothyroidism unspecified on levothyroxine  Code Status:     Code Status Orders  (From admission, onward)        Start     Ordered   04/26/17 0058  Do not attempt resuscitation (DNR)  Continuous    Question Answer Comment  In the event of cardiac or respiratory ARREST Do not call a "code  blue"   In the event of cardiac or respiratory ARREST Do not perform Intubation, CPR, defibrillation or ACLS   In the event of cardiac or respiratory ARREST Use medication by any route, position, wound care, and other measures to relive pain and suffering. May use oxygen, suction and manual treatment of airway obstruction as needed for comfort.      04/26/17 0057    Code Status History    Date Active Date Inactive Code Status Order ID Comments User Context   04/11/2017 22:36 04/12/2017 16:50 DNR [id number]  Altamese DillingVachhani, Vaibhavkumar, MD Inpatient   04/04/2016 01:06 04/05/2016 16:02 DNR [id number]  Tonye RoyaltyHugelmeyer, Alexis, DO Inpatient    Advance Directive Documentation     Most Recent Value  Type of Advance Directive  Healthcare Power of Attorney, Living will  Pre-existing out of facility DNR order (yellow form or pink MOST form)  No data  "  MOST" Form in Place?  No data     Family Communication: Left message for daughter Disposition Plan: Potentially back to [facility] memory care tomorrow after urine culture sensitivities resulted  Antibiotics:  Rocephin  Time spent: 25 minutes  Alford Highland  [employer]

## 2017-04-28 LAB — URINE CULTURE: Culture: >=100000 — AB

## 2017-04-28 LAB — GLUCOSE, CAPILLARY
Glucose-Capillary: 136 mg/dL — ABNORMAL HIGH (ref 65–99)
Glucose-Capillary: 134 mg/dL — ABNORMAL HIGH (ref 65–99)

## 2017-04-28 MED ORDER — FERROUS SULFATE 325 (65 FE) MG PO TABS: 325.0000 mg | ORAL_TABLET | Freq: Every day | ORAL | Status: DC

## 2017-04-28 MED ORDER — FERROUS SULFATE 325 (65 FE) MG PO TABS
325.0000 mg | ORAL_TABLET | Freq: Every day | ORAL | 0 refills | Status: AC
Start: 2017-04-29 — End: ?

## 2017-04-28 MED ORDER — CEPHALEXIN 500 MG PO CAPS: 500.0000 mg | ORAL_CAPSULE | Freq: Four times a day (QID) | ORAL | Status: DC

## 2017-04-28 MED ORDER — CEPHALEXIN 500 MG PO CAPS: 500.0000 mg | ORAL_CAPSULE | Freq: Four times a day (QID) | ORAL | 0 refills | Status: DC

## 2017-04-28 NOTE — Care Management Note (Signed)
Case Management Note  Patient Details  Name: Tobias AlexanderCarolyn L Raudenbush MRN: [id number] Date of Birth: Aug 22, 1940  Subjective/Objective:   Ms Junius FinnerLapham [facility] and will resume home health services with [facility]. Referral called to Sarah at [facility].                  Action/Plan:   Expected Discharge Date:  04/28/17               Expected Discharge Plan:     In-House Referral:  Clinical Social Work  Discharge planning Services  CM Consult  Post Acute Care Choice:  Home Health Choice offered to:  Patient  DME Arranged:    DME Agency:     HH Arranged:    HH Agency:  [facility]  Status of Service:  Completed, signed off  If discussed at Long Length of Stay Meetings, dates discussed:    Additional Comments:  Briella Hobday A, RN 04/28/2017, 12:38 PM

## 2017-04-28 NOTE — Evaluation (Signed)
Physical Therapy Evaluation Patient Details Name: Glenda Vaughn MRN: [id number] DOB: [birth year] Today's Date: 04/28/2017   History of Present Illness  Patient is a pleasant 76 y.o female with a diagnosis of sepsis due to multidrug-resistant E. coli infection after unwitnessed fall. Pt. has hx of dementia and not accurate source of history due to inability to answer questions and follow commands greater than 2 steps.   Clinical Impression  Patient is a pleasant 76 y.o female who presents with gross weakness secondary to hospitalization for encephalopathy. Pt. Has history of altered mental status and requires max cueing for task orientation and sequencing. Seated balance fair +. Standing balance challenged by cognitive status. Pt. Demonstrated ability to ambulate, requiring walker due to limited ability to follow 2 step commands and confusion upon standing. Patient would benefit from home health physical therapy upon d/c and 1x/week PT while in hospital to improve strength and mobility to return to previous level of function.     Follow Up Recommendations Home health PT(pt goes to [facility]wk)    Equipment Recommendations  Standard walker    Recommendations for Other Services       Precautions / Restrictions Precautions Precautions: Fall Restrictions Weight Bearing Restrictions: No      Mobility  Bed Mobility Overal bed mobility: Independent             General bed mobility comments: Patient independent to EOB  Transfers Overall transfer level: Independent Equipment used: None             General transfer comment: Perform STS independently w/o assist. Verbal cueing for task sequencing.   Ambulation/Gait Ambulation/Gait assistance: Modified independent (Device/Increase time);Min guard Ambulation Distance (Feet): 100 Feet Assistive device: Standard walker     Gait velocity interpretation: <1.8 ft/sec, indicative of risk for recurrent falls General  Gait Details: Patient ambulates with standard walker in hallway due to difficulty following commands. Toe out posturing with scuffing of bottom of feet bilaterally and decreased step length. No LOB.   Stairs            Wheelchair Mobility    Modified Rankin (Stroke Patients Only)       Balance                                             Pertinent Vitals/Pain Pain Assessment: No/denies pain    Home Living Family/patient expects to be discharged to:: Assisted living                      Prior Function Level of Independence: Independent         Comments: patient unable to express prior level of function due to cognitive deficits      Hand Dominance        Extremity/Trunk Assessment   Upper Extremity Assessment Upper Extremity Assessment: Generalized weakness(R/L UE: 4/5 strength, difficutly following commands)    Lower Extremity Assessment Lower Extremity Assessment: Generalized weakness(R/L LE 4/5  grossly, knee extension RLE 4-/5)       Communication   Communication: Expressive difficulties  Cognition Arousal/Alertness: Awake/alert Behavior During Therapy: Flat affect Overall Cognitive Status: History of cognitive impairments - at baseline  General Comments: Patient able to name her name and hospital name, unable to orient to time, situation, or past history.       General Comments General comments (skin integrity, edema, etc.): Patient presents with Fair + seated balance. Due to cognitive deficits pt was challenged by standing balance.     Exercises General Exercises - Lower Extremity Ankle Circles/Pumps: AROM;Strengthening;Both;20 reps;Supine Heel Slides: AROM;Strengthening;Both;10 reps Hip ABduction/ADduction: AROM;Strengthening;Both;10 reps;Supine Hip Flexion/Marching: AROM;Strengthening;Seated;10 reps Other Exercises Other Exercises: sit to stand transfer 3x, cues for  sequencing for safety   Assessment/Plan    PT Assessment Patient needs continued PT services  PT Problem List Decreased strength;Decreased range of motion;Decreased activity tolerance;Decreased balance;Decreased mobility;Decreased knowledge of precautions       PT Treatment Interventions DME instruction;Gait training;Therapeutic exercise;Therapeutic activities;Functional mobility training;Balance training;Patient/family education;Neuromuscular re-education    PT Goals (Current goals can be found in the Care Plan section)  Acute Rehab PT Goals Patient Stated Goal: Patient wishes to return home PT Goal Formulation: With patient Time For Goal Achievement: 05/12/17 Potential to Achieve Goals: Fair    Frequency Min 1X/week   Barriers to discharge        Co-evaluation               AM-PAC PT "6 Clicks" Daily Activity  Outcome Measure Difficulty turning over in bed (including adjusting bedclothes, sheets and blankets)?: None Difficulty moving from lying on back to sitting on the side of the bed? : None Difficulty sitting down on and standing up from a chair with arms (e.g., wheelchair, bedside commode, etc,.)?: A Little Help needed moving to and from a bed to chair (including a wheelchair)?: None Help needed walking in hospital room?: A Little Help needed climbing 3-5 steps with a railing? : A Little 6 Click Score: 21    End of Session Equipment Utilized During Treatment: Gait belt Activity Tolerance: Patient tolerated treatment well;Treatment limited secondary to medical complications (Comment)(limited ability to follow commands) Patient left: with bed alarm set;with call bell/phone within reach;in bed Nurse Communication: Mobility status PT Visit Diagnosis: Muscle weakness (generalized) (M62.81);Difficulty in walking, not elsewhere classified (R26.2);History of falling (Z91.81)    Time: [id number] PT Time Calculation (min) (ACUTE ONLY): 24 min   Charges:   PT  Evaluation $PT Eval Low Complexity: 1 Low PT Treatments $Therapeutic Exercise: 8-22 mins   PT G Codes:   PT G-Codes **NOT FOR INPATIENT CLASS** Functional Assessment Tool Used: AM-PAC 6 Clicks Basic Mobility;Clinical judgement Functional Limitation: Mobility: Walking and moving around Mobility: Walking and Moving Around Current Status ([id number](G8978): At least 20 percent but less than 40 percent impaired, limited or restricted Mobility: Walking and Moving Around Goal Status [phone](G8979): At least 1 percent but less than 20 percent impaired, limited or restricted    Glenda Vaughn, PT, DPT    Glenda Vaughn 04/28/2017, 10:29 AM

## 2017-04-28 NOTE — Progress Notes (Signed)
Glenda Vaughn to be D/C'd Glenda Vaughn per MD order.  Discussed with the patient and all questions fully answered.  VSS, Skin clean, dry and intact without evidence of skin break down, no evidence of skin tears noted. IV catheter discontinued intact. Site without signs and symptoms of complications. Dressing and pressure applied.  An After Visit Summary was printed and given to the patient. Patient received prescription.  D/c education completed with patient/family including follow up instructions, medication list, d/c activities limitations if indicated, with other d/c instructions as indicated by MD - patient able to verbalize understanding, all questions fully answered.   Patient instructed to return to ED, call 911, or call MD for any changes in condition.   Patient escorted via WC, and D/C home via private auto with so.  Harvie HeckMelanie Yahayra Geis 04/28/2017 12:58 PM

## 2017-04-28 NOTE — Clinical Social Work Note (Signed)
CSW contacted the patient's son to discuss imminent discharge. Glenda Vaughn stated that [place] and will arrive around noon. The CSW will deliver the packet to the patient's chart once the FL 2 is available. CSW will continue to follow pending additional discharge needs.  Glenda Vaughn, MSW, Theresia MajorsLCSWA [phone]

## 2017-04-28 NOTE — Discharge Summary (Signed)
[facility] [place] [facility]   PATIENT NAME: Glenda Vaughn    MR#:  [id number]  DATE OF BIRTH:  1941-05-11  DATE OF ADMISSION:  04/25/2017 ADMITTING PHYSICIAN: Bertrum Sol, MD  DATE OF DISCHARGE: 04/28/2017  PRIMARY CARE PHYSICIAN: Jaclyn Shaggy, MD    ADMISSION DIAGNOSIS:  Lower urinary tract infectious disease [N39.0] Hyperglycemia [R73.9]  DISCHARGE DIAGNOSIS:  Active Problems:   Encephalopathy acute   SECONDARY DIAGNOSIS:   Past Medical History:  Diagnosis Date  . Alzheimer's dementia   . Depression   . Diabetes mellitus without complication (HCC)   . Hyperlipidemia   . Hypertension   . Thyroid disease     HOSPITAL COURSE:   1.  Acute cystitis with hematuria with acute encephalopathy and underlying dementia.  Urine culture growing E. coli which is sensitive to Rocephin which she was on here and Keflex which I will be sending her home on.  Since she recently was treated for this urinary tract infection, I will give high-dose Keflex 500 mg 4 times a day for 7 days starting tomorrow 04/29/2017.  She will get a Rocephin dose IV today prior to disposition. 2.  Dementia with behavioral disturbance.  I gave a dose of Seroquel last night.  Which helped her sleep here in the hospital.  I do not need to give this back in her usual environment but it did work, just in case she has future hospitalizations.  On Namenda. 3.  Type 2 diabetes mellitus on metformin and glimepiride.  Recommend checking fingersticks q. before meals and nightly 4.  Hyperlipidemia unspecified stop atorvastatin.  Last year had rhabdomyolysis. 5.  Hypothyroidism unspecified on levothyroxine 6.  Anemia.  Continue to follow-up as outpatient.  From prior labs last year it looks like this is anemia iron deficiency.  Start iron replacement as outpatient.  DISCHARGE CONDITIONS:   Satisfactory  CONSULTS OBTAINED:  None  DRUG ALLERGIES:  No Known Allergies  DISCHARGE  MEDICATIONS:   Current Discharge Medication List    START taking these medications   Details  ferrous sulfate 325 (65 FE) MG tablet Take 1 tablet (325 mg total) daily with breakfast by mouth. Qty: 30 tablet, Refills: 0      CONTINUE these medications which have CHANGED   Details  cephALEXin (KEFLEX) 500 MG capsule Take 1 capsule (500 mg total) every 6 (six) hours by mouth. Qty: 28 capsule, Refills: 0      CONTINUE these medications which have NOT CHANGED   Details  Ascorbic Acid (VITAMIN C) 500 MG CHEW Take 500 mg by mouth daily.    aspirin 81 MG chewable tablet Chew 81 mg by mouth daily.    Calcium Carbonate-Vitamin D3 (CALCIUM 600-D) 600-400 MG-UNIT TABS Take 1 tablet by mouth daily.    Diphenhydramine-APAP, sleep, (TYLENOL PM EXTRA STRENGTH PO) Take 1 tablet by mouth every evening.    donepezil (ARICEPT) 10 MG tablet Take 10 mg by mouth daily.    FLUoxetine (PROZAC) 20 MG capsule Take 20 mg by mouth daily.    glimepiride (AMARYL) 4 MG tablet Take 4 mg by mouth 2 (two) times daily.     levothyroxine (SYNTHROID, LEVOTHROID) 125 MCG tablet Take 125 mcg by mouth every morning.     loperamide (IMODIUM) 2 MG capsule Take 2 mg by mouth as needed for diarrhea or loose stools.    memantine (NAMENDA) 10 MG tablet Take 10 mg by mouth 2 (two) times daily.    metFORMIN (GLUCOPHAGE-XR) 500  MG 24 hr tablet Take 500 mg by mouth 2 (two) times daily.    Multiple Vitamins-Minerals (CERTAVITE SENIOR/ANTIOXIDANT) TABS Take 1 tablet by mouth daily.    naproxen sodium (ANAPROX) 220 MG tablet Take 220 mg by mouth 2 (two) times daily with a meal.    Probiotic Product (ALIGN) 4 MG CAPS Take 4 mg by mouth daily.    Multiple Vitamin (MULTIVITAMIN) tablet Take 1 tablet by mouth daily.      STOP taking these medications     atorvastatin (LIPITOR) 20 MG tablet          DISCHARGE INSTRUCTIONS:   Follow-up PMD 1 week  If you experience worsening of your admission symptoms, develop  shortness of breath, life threatening emergency, suicidal or homicidal thoughts you must seek medical attention immediately by calling 911 or calling your MD immediately  if symptoms less severe.  You Must read complete instructions/literature along with all the possible adverse reactions/side effects for all the Medicines you take and that have been prescribed to you. Take any new Medicines after you have completely understood and accept all the possible adverse reactions/side effects.   Please note  You were cared for by a hospitalist during your hospital stay. If you have any questions about your discharge medications or the care you received while you were in the hospital after you are discharged, you can call the unit and asked to speak with the hospitalist on call if the hospitalist that took care of you is not available. Once you are discharged, your primary care physician will handle any further medical issues. Please note that NO REFILLS for any discharge medications will be authorized once you are discharged, as it is imperative that you return to your primary care physician (or establish a relationship with a primary care physician if you do not have one) for your aftercare needs so that they can reassess your need for medications and monitor your lab values.    Today   CHIEF COMPLAINT:   Chief Complaint  Patient presents with  . Hyperglycemia  . Fall    HISTORY OF PRESENT ILLNESS:  Glenda PartridgeCarolyn Vaughn  is a 76 y.o. female with a known history of dementia presented with hyperglycemia and fall and found to have a urinary tract infection.    VITAL SIGNS:  Blood pressure 120/61, pulse 83, temperature 99.7 F (37.6 C), temperature source Oral, resp. rate 18, height 5\' 6"  (1.676 m), weight 65.8 kg (145 lb), SpO2 98 %.   PHYSICAL EXAMINATION:  GENERAL:  76 y.o.-year-old patient sitting in the bed with no acute distress.  EYES: Pupils equal, round, reactive to light and accommodation.  No scleral icterus. Extraocular muscles intact.  HEENT: Head atraumatic, normocephalic. Oropharynx and nasopharynx clear.  NECK:  Supple, no jugular venous distention. No thyroid enlargement, no tenderness.  LUNGS: Normal breath sounds bilaterally, no wheezing, rales,rhonchi or crepitation. No use of accessory muscles of respiration.  CARDIOVASCULAR: S1, S2 normal. No murmurs, rubs, or gallops.  ABDOMEN: Soft, non-tender, non-distended. Bowel sounds present. No organomegaly or mass.  EXTREMITIES: No pedal edema, cyanosis, or clubbing.  NEUROLOGIC: Cranial nerves II through XII are intact. Muscle strength 5/5 in all extremities. Sensation intact. Gait not checked.  PSYCHIATRIC: The patient is alert.  SKIN: No obvious rash, lesion, or ulcer.   DATA REVIEW:   CBC Recent Labs  Lab 04/27/17 0404  WBC 13.6*  HGB 9.4*  HCT 28.1*  PLT 376    Chemistries  Recent Labs  Lab  04/25/17 1822  NA 135  K 3.9  CL 103  CO2 19*  GLUCOSE 361*  BUN 20  CREATININE 0.90  CALCIUM 8.4*     Microbiology Results  Results for orders placed or performed during the hospital encounter of 04/25/17  Urine culture     Status: Abnormal   Collection Time: 04/25/17  6:38 PM  Result Value Ref Range Status   Specimen Description URINE, RANDOM  Final   Special Requests NONE  Final   Culture >=100,000 COLONIES/mL ESCHERICHIA COLI (A)  Final   Report Status 04/28/2017 FINAL  Final   Organism ID, Bacteria ESCHERICHIA COLI (A)  Final      Susceptibility   Escherichia coli - MIC*    AMPICILLIN >=32 RESISTANT Resistant     CEFAZOLIN 16 SENSITIVE Sensitive     CEFTRIAXONE <=1 SENSITIVE Sensitive     CIPROFLOXACIN >=4 RESISTANT Resistant     GENTAMICIN <=1 SENSITIVE Sensitive     IMIPENEM <=0.25 SENSITIVE Sensitive     NITROFURANTOIN <=16 SENSITIVE Sensitive     TRIMETH/SULFA >=320 RESISTANT Resistant     AMPICILLIN/SULBACTAM >=32 RESISTANT Resistant     PIP/TAZO 8 SENSITIVE Sensitive     Extended ESBL  NEGATIVE Sensitive     * >=100,000 COLONIES/mL ESCHERICHIA COLI  MRSA PCR Screening     Status: None   Collection Time: 04/27/17  8:56 PM  Result Value Ref Range Status   MRSA by PCR NEGATIVE NEGATIVE Final    Comment:        The GeneXpert MRSA Assay (FDA approved for NASAL specimens only), is one component of a comprehensive MRSA colonization surveillance program. It is not intended to diagnose MRSA infection nor to guide or monitor treatment for MRSA infections.        Management plans discussed with the patient, family and they are in agreement.  CODE STATUS:     Code Status Orders  (From admission, onward)        Start     Ordered   04/26/17 0058  Do not attempt resuscitation (DNR)  Continuous    Question Answer Comment  In the event of cardiac or respiratory ARREST Do not call a "code blue"   In the event of cardiac or respiratory ARREST Do not perform Intubation, CPR, defibrillation or ACLS   In the event of cardiac or respiratory ARREST Use medication by any route, position, wound care, and other measures to relive pain and suffering. May use oxygen, suction and manual treatment of airway obstruction as needed for comfort.      04/26/17 0057    Code Status History    Date Active Date Inactive Code Status Order ID Comments User Context   04/11/2017 22:36 04/12/2017 16:50 DNR [id number]  Altamese DillingVachhani, Vaibhavkumar, MD Inpatient   04/04/2016 01:06 04/05/2016 16:02 DNR [id number]  Tonye RoyaltyHugelmeyer, Alexis, DO Inpatient    Advance Directive Documentation     Most Recent Value  Type of Advance Directive  Healthcare Power of Attorney, Living will  Pre-existing out of facility DNR order (yellow form or pink MOST form)  No data  "MOST" Form in Place?  No data      TOTAL TIME TAKING CARE OF THIS PATIENT: 35 minutes.    Alford HighlandWIETING, Demareon Coldwell M.D on 04/28/2017 at 9:29 AM  Between 7am to 6pm - Pager - [phone]  After 6pm go to [email or web address] - password [email or web address]  [facility]  [phone]  CC: Primary care physician; Jaclyn Shaggyate, Denny C, MD

## 2017-04-28 NOTE — Progress Notes (Signed)
Patient alert and oriented to person an place. Denies pain. Safety sitter discontinued. Bed alarm in place.   Harvie HeckMelanie Madiha Bambrick, RN

## 2017-11-09 ENCOUNTER — Other Ambulatory Visit: Payer: Self-pay

## 2017-11-09 ENCOUNTER — Emergency Department
Admission: EM | Admit: 2017-11-09 | Discharge: 2017-11-09 | Disposition: A | Discharge status: Home / Self Care | Payer: Medicare Other | Attending: Emergency Medicine | Admitting: Emergency Medicine

## 2017-11-09 ENCOUNTER — Encounter: Payer: Self-pay | Admitting: Emergency Medicine

## 2017-11-09 DIAGNOSIS — Z7984 Long term (current) use of oral hypoglycemic drugs: Secondary | ICD-10-CM | POA: Diagnosis not present

## 2017-11-09 DIAGNOSIS — R319 Hematuria, unspecified: Secondary | ICD-10-CM | POA: Diagnosis present

## 2017-11-09 DIAGNOSIS — F0280 Dementia in other diseases classified elsewhere without behavioral disturbance: Secondary | ICD-10-CM | POA: Insufficient documentation

## 2017-11-09 DIAGNOSIS — I10 Essential (primary) hypertension: Secondary | ICD-10-CM | POA: Diagnosis not present

## 2017-11-09 DIAGNOSIS — Z7982 Long term (current) use of aspirin: Secondary | ICD-10-CM | POA: Insufficient documentation

## 2017-11-09 DIAGNOSIS — N3001 Acute cystitis with hematuria: Secondary | ICD-10-CM | POA: Insufficient documentation

## 2017-11-09 DIAGNOSIS — G309 Alzheimer's disease, unspecified: Secondary | ICD-10-CM | POA: Diagnosis not present

## 2017-11-09 DIAGNOSIS — E119 Type 2 diabetes mellitus without complications: Secondary | ICD-10-CM | POA: Insufficient documentation

## 2017-11-09 LAB — URINALYSIS, COMPLETE (UACMP) WITH MICROSCOPIC
Bilirubin Urine: NEGATIVE
KETONES UR: NEGATIVE mg/dL
NITRITE: NEGATIVE
PH: 5 (ref 5.0–8.0)
PROTEIN: 100 mg/dL — AB
RBC / HPF: >50 RBC/hpf — ABNORMAL HIGH (ref 0–5)
Specific Gravity, Urine: 1.017 (ref 1.005–1.030)
Squamous Epithelial / LPF: NONE SEEN (ref 0–5)
WBC, UA: >50 WBC/hpf — ABNORMAL HIGH (ref 0–5)

## 2017-11-09 LAB — CBC
HCT: 30.3 % — ABNORMAL LOW (ref 35.0–47.0)
HEMOGLOBIN: 10.1 g/dL — AB (ref 12.0–16.0)
MCH: 28.4 pg (ref 26.0–34.0)
MCHC: 33.3 g/dL (ref 32.0–36.0)
MCV: 85.5 fL (ref 80.0–100.0)
Platelets: 475 10*3/uL — ABNORMAL HIGH (ref 150–440)
RBC: 3.54 MIL/uL — ABNORMAL LOW (ref 3.80–5.20)
RDW: 18.1 % — AB (ref 11.5–14.5)
WBC: 13.5 10*3/uL — ABNORMAL HIGH (ref 3.6–11.0)

## 2017-11-09 LAB — BASIC METABOLIC PANEL
Anion gap: 8 (ref 5–15)
BUN: 24 mg/dL — AB (ref 6–20)
CALCIUM: 9.2 mg/dL (ref 8.9–10.3)
CHLORIDE: 104 mmol/L (ref 101–111)
CO2: 24 mmol/L (ref 22–32)
CREATININE: 1.18 mg/dL — AB (ref 0.44–1.00)
GFR calc non Af Amer: 44 mL/min — ABNORMAL LOW (ref >60)
GFR, EST AFRICAN AMERICAN: 51 mL/min — AB (ref >60)
Glucose, Bld: 341 mg/dL — ABNORMAL HIGH (ref 65–99)
Potassium: 4 mmol/L (ref 3.5–5.1)
Sodium: 136 mmol/L (ref 135–145)

## 2017-11-09 MED ORDER — SODIUM CHLORIDE 0.9 % IV SOLN
1.0000 g | Freq: Once | INTRAVENOUS | Status: AC
  Administered 2017-11-09: 1 g via INTRAVENOUS
  Filled 2017-11-09: qty 10

## 2017-11-09 MED ORDER — CEPHALEXIN 500 MG PO CAPS: 500.0000 mg | ORAL_CAPSULE | Freq: Two times a day (BID) | ORAL | 0 refills | Status: DC

## 2017-11-09 NOTE — ED Triage Notes (Signed)
Pt arrived via POV with daughter from [place].  Daughter states she received phone call from facility that patient had blood in urine and were concerned that the patient was menstruating.    Daughter states pt has hx of frequent UTIs and wears a depends that frequently is soiled.  Pt is mostly incontinent of urine and stool.  Pt has hx of dementia and daughter states pt is a baseline.  Daughter states pt is in much better shape than normally when she has a UTI.

## 2017-11-09 NOTE — ED Notes (Signed)
ED Provider at bedside. 

## 2017-11-09 NOTE — ED Notes (Signed)
EDP requested in & out catheter for urine specimen collection. Done by this RN with Tobi Bastos, RN as second in the room.

## 2017-11-09 NOTE — ED Notes (Addendum)
Pt ambulatory to wheel chair upon discharge. Pt and family verbalized understanding of discharge instructions, follow-up care and prescriptions. Skin warm and dry.   Pt daughter e-signed for discharge.

## 2017-11-09 NOTE — ED Provider Notes (Signed)
[facility] Emergency Department Provider Note   ____________________________________________    I have reviewed the triage vital signs and the nursing notes.   HISTORY  Chief Complaint Hematuria   Patient has Alzheimer's dementia, unable to provide history, history as per daughter  HPI Glenda Vaughn is a 77 y.o. female sent to the ED for reports of blood in her diaper.  Daughter reports that the patient actually has blood in her urine, has been with her last 2 times she is urinated in both times urine has been orange with a tinge of red.  No reports of fevers.  Patient has had frequent UTIs but is at her baseline today her daughter  Past Medical History:  Diagnosis Date  . Alzheimer's dementia   . Depression   . Diabetes mellitus without complication (HCC)   . Hyperlipidemia   . Hypertension   . Thyroid disease     Patient Active Problem List   Diagnosis Date Noted  . Encephalopathy acute 04/25/2017  . Sepsis (HCC) 04/11/2017  . Elevated troponin 04/04/2016  . Fall 04/04/2016  . Alzheimer's dementia 04/04/2016  . Generalized weakness 04/04/2016  . Acute cystitis without hematuria 04/04/2016  . Iron deficiency anemia 04/04/2016  . Hyponatremia 04/04/2016  . Mild aortic stenosis 04/04/2016  . Urinary tract infection with hematuria     History reviewed. No pertinent surgical history.  Prior to Admission medications   Medication Sig Start Date End Date Taking? Authorizing Provider  Ascorbic Acid (VITAMIN C) 500 MG CHEW Take 500 mg by mouth daily.    [provider]  aspirin 81 MG chewable tablet Chew 81 mg by mouth daily.    [provider]  Calcium Carbonate-Vitamin D3 (CALCIUM 600-D) 600-400 MG-UNIT TABS Take 1 tablet by mouth daily.    [provider]  cephALEXin (KEFLEX) 500 MG capsule Take 1 capsule (500 mg total) by mouth 2 (two) times daily. 11/09/17   Jene Every, MD  Diphenhydramine-APAP, sleep,  (TYLENOL PM EXTRA STRENGTH PO) Take 1 tablet by mouth every evening.    [provider]  donepezil (ARICEPT) 10 MG tablet Take 10 mg by mouth daily.    [provider]  ferrous sulfate 325 (65 FE) MG tablet Take 1 tablet (325 mg total) daily with breakfast by mouth. 04/29/17   Renae Gloss, Richard, MD  FLUoxetine (PROZAC) 20 MG capsule Take 20 mg by mouth daily.    [provider]  glimepiride (AMARYL) 4 MG tablet Take 4 mg by mouth 2 (two) times daily.     [provider]  levothyroxine (SYNTHROID, LEVOTHROID) 125 MCG tablet Take 125 mcg by mouth every morning.     [provider]  loperamide (IMODIUM) 2 MG capsule Take 2 mg by mouth as needed for diarrhea or loose stools.    [provider]  memantine (NAMENDA) 10 MG tablet Take 10 mg by mouth 2 (two) times daily.    [provider]  metFORMIN (GLUCOPHAGE-XR) 500 MG 24 hr tablet Take 500 mg by mouth 2 (two) times daily.    [provider]  Multiple Vitamin (MULTIVITAMIN) tablet Take 1 tablet by mouth daily.    [provider]  Multiple Vitamins-Minerals (CERTAVITE SENIOR/ANTIOXIDANT) TABS Take 1 tablet by mouth daily.    [provider]  naproxen sodium (ANAPROX) 220 MG tablet Take 220 mg by mouth 2 (two) times daily with a meal.    [provider]  Probiotic Product (ALIGN) 4 MG CAPS  Take 4 mg by mouth daily.    [provider]     Allergies Patient has no known allergies.  Family History  Problem Relation Age of Onset  . Prostate cancer Father     Social History Social History   Tobacco Use  . Smoking status: Never Smoker  . Smokeless tobacco: Never Used  Substance Use Topics  . Alcohol use: No  . Drug use: No    Review of Systems limited by dementia  Constitutional: No reports of fever   Respiratory: No reports of cough Gastrointestinal: No reports of vomiting Genitourinary: As above Musculoskeletal: No reports of  joint swelling Skin: Negative for rash.    ____________________________________________   PHYSICAL EXAM:  VITAL SIGNS: ED Triage Vitals  Enc Vitals Group     BP 11/09/17 1507 135/62     Pulse Rate 11/09/17 1507 86     Resp 11/09/17 1507 20     Temp 11/09/17 1507 97.6 F (36.4 C)     Temp Source 11/09/17 1507 Oral     SpO2 11/09/17 1507 99 %     Weight 11/09/17 1507 56.7 kg (125 lb)     Height 11/09/17 1507 1.676 m ( )     Head Circumference --      Peak Flow --      Pain Score 11/09/17 1638 0     Pain Loc --      Pain Edu? --      Excl. in GC? --     Constitutional: Alert, sitting in chair, no acute distress  Eyes: Conjunctivae are normal.   Nose: No congestion/rhinnorhea. Mouth/Throat: Mucous membranes are moist.    Cardiovascular: Normal rate, regular rhythm. Grossly normal heart sounds.  Good peripheral circulation. Respiratory: Normal respiratory effort.  No retractions Gastrointestinal: Soft and nontender. No distention.    Musculoskeletal: No joint swelling.  Warm and well perfused Neurologic:  Normal speech and language. No gross focal neurologic deficits are appreciated.  Skin:  Skin is warm, dry and intact. No rash noted. Psychiatric: Mood and affect are normal. Speech and behavior are normal.  ____________________________________________   LABS (all labs ordered are listed, but only abnormal results are displayed)  Labs Reviewed  URINALYSIS, COMPLETE (UACMP) WITH MICROSCOPIC - Abnormal; Notable for the following components:      Result Value   Color, Urine RED (*)    APPearance BLOOD (*)    Glucose, UA >=500 (*)    Hgb urine dipstick LARGE (*)    Protein, ur 100 (*)    Leukocytes, UA LARGE (*)    RBC / HPF >50 (*)    WBC, UA >50 (*)    Bacteria, UA MANY (*)    All other components within normal limits  CBC - Abnormal; Notable for the following components:   WBC 13.5 (*)    RBC 3.54 (*)    Hemoglobin 10.1 (*)    HCT 30.3 (*)    RDW 18.1  (*)    Platelets 475 (*)    All other components within normal limits  BASIC METABOLIC PANEL - Abnormal; Notable for the following components:   Glucose, Bld 341 (*)    BUN 24 (*)    Creatinine, Ser 1.18 (*)    GFR calc non Af Amer 44 (*)    GFR calc Af Amer 51 (*)    All other components within normal limits  URINE CULTURE   ____________________________________________  EKG  None ____________________________________________  RADIOLOGY  None  ____________________________________________   PROCEDURES  Procedure(s) performed: No  Procedures   Critical Care performed: No ____________________________________________   INITIAL IMPRESSION / ASSESSMENT AND PLAN / ED COURSE  Pertinent labs & imaging results that were available during my care of the patient were reviewed by me and considered in my medical decision making (see chart for details).  Patient presents with reports of hematuria, overall well-appearing in no acute distress.  Lab work is overall unremarkable, prior CBCs with chronically elevated white blood cell count, mild elevation in sugar.  Hemoglobin stable  Urinalysis consistent with UTI, will treat with IV Rocephin, outpatient Rx, close follow-up.  Return precautions discussed.  Daughter is supportive of this plan    ____________________________________________   FINAL CLINICAL IMPRESSION(S) / ED DIAGNOSES  Final diagnoses:  Acute cystitis with hematuria        Note:  This document was prepared using Dragon voice recognition software and may include unintentional dictation errors.    Jene Every, MD 11/09/17 [id number]

## 2017-11-12 LAB — URINE CULTURE

## 2017-11-13 NOTE — Progress Notes (Signed)
ED Antimicrobial Stewardship Positive Culture Follow Up   Glenda Vaughn is an 77 y.o. female who presented to [facility] on 11/09/2017 with a chief complaint of hematuria. Discharge diagnosis of acute cystitis with hematuria. Patient discharged home with cephalexin.    Treated with cephalexin, organism resistant to prescribed antimicrobial  New antibiotic prescription: Cefdinir ; take 1 tablet by mouth twice daily for 7 days   Spoke with daughter who states patient is resident of [address] in [place]. Discussed with daughter that patient should stop taking cephalexin and start taking new antibiotic, cefdinir. Daughter states she does not think patient had even started taking the cephalexin yet. I called and faxed over new prescription to [facility].    ED Provider: Dr. Scotty Court     Chief Complaint  Patient presents with  . Hematuria    Recent Results (from the past 720 hour(s))  Urine C&S     Status: Abnormal   Collection Time: 11/09/17  4:57 PM  Result Value Ref Range Status   Specimen Description   Final    URINE, RANDOM Performed at [facility], [address] [place], [place] [address]    Special Requests   Final    NONE Performed at [facility], [address] [place], [place] [address]    Culture >=100,000 COLONIES/mL ESCHERICHIA COLI (A)  Final   Report Status 11/12/2017 FINAL  Final   Organism ID, Bacteria ESCHERICHIA COLI (A)  Final      Susceptibility   Escherichia coli - MIC*    AMPICILLIN >=32 RESISTANT Resistant     CEFAZOLIN >=64 RESISTANT Resistant     CEFTRIAXONE <=1 SENSITIVE Sensitive     CIPROFLOXACIN >=4 RESISTANT Resistant     GENTAMICIN <=1 SENSITIVE Sensitive     IMIPENEM <=0.25 SENSITIVE Sensitive     NITROFURANTOIN <=16 SENSITIVE Sensitive     TRIMETH/SULFA >=320 RESISTANT Resistant     AMPICILLIN/SULBACTAM >=32 RESISTANT Resistant     PIP/TAZO 64 INTERMEDIATE Intermediate     Extended ESBL NEGATIVE Sensitive     * >=100,000 COLONIES/mL ESCHERICHIA COLI    Cleopatra Cedar, PharmD Pharmacy Resident  11/13/2017, 9:31 AM

## 2017-12-17 ENCOUNTER — Observation Stay
Admission: EM | Admit: 2017-12-17 | Discharge: 2017-12-19 | Disposition: A | Discharge status: Home / Self Care | Payer: Medicare Other | Attending: Family Medicine | Admitting: Family Medicine

## 2017-12-17 ENCOUNTER — Emergency Department: Payer: Medicare Other

## 2017-12-17 ENCOUNTER — Encounter: Payer: Self-pay | Admitting: Emergency Medicine

## 2017-12-17 DIAGNOSIS — G309 Alzheimer's disease, unspecified: Secondary | ICD-10-CM | POA: Insufficient documentation

## 2017-12-17 DIAGNOSIS — Z66 Do not resuscitate: Secondary | ICD-10-CM | POA: Insufficient documentation

## 2017-12-17 DIAGNOSIS — R739 Hyperglycemia, unspecified: Secondary | ICD-10-CM | POA: Diagnosis present

## 2017-12-17 DIAGNOSIS — Z7984 Long term (current) use of oral hypoglycemic drugs: Secondary | ICD-10-CM | POA: Insufficient documentation

## 2017-12-17 DIAGNOSIS — N183 Chronic kidney disease, stage 3 (moderate): Secondary | ICD-10-CM | POA: Diagnosis not present

## 2017-12-17 DIAGNOSIS — F0280 Dementia in other diseases classified elsewhere without behavioral disturbance: Secondary | ICD-10-CM | POA: Diagnosis not present

## 2017-12-17 DIAGNOSIS — B9620 Unspecified Escherichia coli [E. coli] as the cause of diseases classified elsewhere: Secondary | ICD-10-CM | POA: Diagnosis not present

## 2017-12-17 DIAGNOSIS — R2689 Other abnormalities of gait and mobility: Secondary | ICD-10-CM | POA: Diagnosis not present

## 2017-12-17 DIAGNOSIS — Z1624 Resistance to multiple antibiotics: Secondary | ICD-10-CM | POA: Insufficient documentation

## 2017-12-17 DIAGNOSIS — Z79899 Other long term (current) drug therapy: Secondary | ICD-10-CM | POA: Insufficient documentation

## 2017-12-17 DIAGNOSIS — A419 Sepsis, unspecified organism: Secondary | ICD-10-CM | POA: Diagnosis present

## 2017-12-17 DIAGNOSIS — E1165 Type 2 diabetes mellitus with hyperglycemia: Secondary | ICD-10-CM | POA: Insufficient documentation

## 2017-12-17 DIAGNOSIS — I129 Hypertensive chronic kidney disease with stage 1 through stage 4 chronic kidney disease, or unspecified chronic kidney disease: Secondary | ICD-10-CM | POA: Diagnosis not present

## 2017-12-17 DIAGNOSIS — Z791 Long term (current) use of non-steroidal anti-inflammatories (NSAID): Secondary | ICD-10-CM | POA: Insufficient documentation

## 2017-12-17 DIAGNOSIS — F329 Major depressive disorder, single episode, unspecified: Secondary | ICD-10-CM | POA: Diagnosis not present

## 2017-12-17 DIAGNOSIS — Z7982 Long term (current) use of aspirin: Secondary | ICD-10-CM | POA: Diagnosis not present

## 2017-12-17 DIAGNOSIS — E1122 Type 2 diabetes mellitus with diabetic chronic kidney disease: Secondary | ICD-10-CM | POA: Diagnosis not present

## 2017-12-17 DIAGNOSIS — Z79890 Hormone replacement therapy (postmenopausal): Secondary | ICD-10-CM | POA: Diagnosis not present

## 2017-12-17 DIAGNOSIS — N3001 Acute cystitis with hematuria: Secondary | ICD-10-CM

## 2017-12-17 DIAGNOSIS — E039 Hypothyroidism, unspecified: Secondary | ICD-10-CM | POA: Diagnosis not present

## 2017-12-17 LAB — BASIC METABOLIC PANEL
Anion gap: 12 (ref 5–15)
BUN: 27 mg/dL — ABNORMAL HIGH (ref 8–23)
CALCIUM: 9.6 mg/dL (ref 8.9–10.3)
CO2: 25 mmol/L (ref 22–32)
CREATININE: 1.36 mg/dL — AB (ref 0.44–1.00)
Chloride: 102 mmol/L (ref 98–111)
GFR calc non Af Amer: 37 mL/min — ABNORMAL LOW (ref >60)
GFR, EST AFRICAN AMERICAN: 43 mL/min — AB (ref >60)
Glucose, Bld: 332 mg/dL — ABNORMAL HIGH (ref 70–99)
Potassium: 3.9 mmol/L (ref 3.5–5.1)
SODIUM: 139 mmol/L (ref 135–145)

## 2017-12-17 LAB — GLUCOSE, CAPILLARY
GLUCOSE-CAPILLARY: 313 mg/dL — AB (ref 70–99)
GLUCOSE-CAPILLARY: 237 mg/dL — AB (ref 70–99)
GLUCOSE-CAPILLARY: 186 mg/dL — AB (ref 70–99)
GLUCOSE-CAPILLARY: 307 mg/dL — AB (ref 70–99)

## 2017-12-17 LAB — URINALYSIS, COMPLETE (UACMP) WITH MICROSCOPIC
BILIRUBIN URINE: NEGATIVE
Glucose, UA: >=500 mg/dL — AB
KETONES UR: NEGATIVE mg/dL
NITRITE: POSITIVE — AB
PROTEIN: 100 mg/dL — AB
Specific Gravity, Urine: 1.018 (ref 1.005–1.030)
Squamous Epithelial / LPF: NONE SEEN (ref 0–5)
pH: 5 (ref 5.0–8.0)

## 2017-12-17 LAB — CBC
HCT: 30.7 % — ABNORMAL LOW (ref 35.0–47.0)
Hemoglobin: 10.8 g/dL — ABNORMAL LOW (ref 12.0–16.0)
MCH: 30.1 pg (ref 26.0–34.0)
MCHC: 35.3 g/dL (ref 32.0–36.0)
MCV: 85.3 fL (ref 80.0–100.0)
Platelets: 426 10*3/uL (ref 150–440)
RBC: 3.6 MIL/uL — AB (ref 3.80–5.20)
RDW: 17.4 % — ABNORMAL HIGH (ref 11.5–14.5)
WBC: 10.7 10*3/uL (ref 3.6–11.0)

## 2017-12-17 MED ORDER — GLIMEPIRIDE 2 MG PO TABS
4.0000 mg | ORAL_TABLET | Freq: Two times a day (BID) | ORAL | Status: DC
  Administered 2017-12-18 – 2017-12-19 (×3): 4 mg via ORAL
  Filled 2017-12-17 (×2): qty 2
  Filled 2017-12-17: qty 1
  Filled 2017-12-17 (×2): qty 2

## 2017-12-17 MED ORDER — ASPIRIN 81 MG PO CHEW
81.0000 mg | CHEWABLE_TABLET | Freq: Every day | ORAL | Status: DC
  Administered 2017-12-18 – 2017-12-19 (×2): 81 mg via ORAL
  Filled 2017-12-17 (×2): qty 1

## 2017-12-17 MED ORDER — CALCIUM CARBONATE-VITAMIN D 500-200 MG-UNIT PO TABS
ORAL_TABLET | Freq: Every day | ORAL | Status: DC
  Administered 2017-12-18 – 2017-12-19 (×2): 1 via ORAL
  Filled 2017-12-17 (×2): qty 1

## 2017-12-17 MED ORDER — CEFTRIAXONE SODIUM 2 G IJ SOLR
2.0000 g | Freq: Once | INTRAMUSCULAR | Status: AC
  Administered 2017-12-17: 2 g via INTRAVENOUS
  Filled 2017-12-17: qty 2

## 2017-12-17 MED ORDER — DONEPEZIL HCL 5 MG PO TABS
10.0000 mg | ORAL_TABLET | Freq: Every day | ORAL | Status: DC
  Administered 2017-12-18: 10 mg via ORAL
  Filled 2017-12-17 (×3): qty 2

## 2017-12-17 MED ORDER — MEMANTINE HCL 5 MG PO TABS
10.0000 mg | ORAL_TABLET | Freq: Two times a day (BID) | ORAL | Status: DC
  Administered 2017-12-18 – 2017-12-19 (×3): 10 mg via ORAL
  Filled 2017-12-17 (×3): qty 2

## 2017-12-17 MED ORDER — ONDANSETRON HCL 4 MG PO TABS: 4.0000 mg | ORAL_TABLET | Freq: Four times a day (QID) | ORAL | Status: DC | PRN

## 2017-12-17 MED ORDER — ACETAMINOPHEN 325 MG PO TABS: 650.0000 mg | ORAL_TABLET | Freq: Four times a day (QID) | ORAL | Status: DC | PRN

## 2017-12-17 MED ORDER — ACETAMINOPHEN 500 MG PO TABS
500.0000 mg | ORAL_TABLET | Freq: Every day | ORAL | Status: DC
  Administered 2017-12-18: 500 mg via ORAL
  Filled 2017-12-17: qty 1

## 2017-12-17 MED ORDER — VITAMIN C 500 MG PO TABS
500.0000 mg | ORAL_TABLET | Freq: Every day | ORAL | Status: DC
  Administered 2017-12-18 – 2017-12-19 (×2): 500 mg via ORAL
  Filled 2017-12-17 (×2): qty 1

## 2017-12-17 MED ORDER — TRAMADOL HCL 50 MG PO TABS: 50.0000 mg | ORAL_TABLET | Freq: Four times a day (QID) | ORAL | Status: DC | PRN

## 2017-12-17 MED ORDER — ONDANSETRON HCL 4 MG/2ML IJ SOLN: 4.0000 mg | Freq: Four times a day (QID) | INTRAMUSCULAR | Status: DC | PRN

## 2017-12-17 MED ORDER — LEVOTHYROXINE SODIUM 100 MCG PO TABS
125.0000 ug | ORAL_TABLET | Freq: Every morning | ORAL | Status: DC
  Administered 2017-12-18 – 2017-12-19 (×2): 125 ug via ORAL
  Filled 2017-12-17 (×2): qty 1

## 2017-12-17 MED ORDER — DIPHENHYDRAMINE-APAP (SLEEP) 25-500 MG PO TABS: 1.0000 | ORAL_TABLET | Freq: Every day | ORAL | Status: DC

## 2017-12-17 MED ORDER — SODIUM CHLORIDE 0.9 % IV SOLN
INTRAVENOUS | Status: AC
  Administered 2017-12-17 – 2017-12-18 (×2): via INTRAVENOUS

## 2017-12-17 MED ORDER — FLUOXETINE HCL 20 MG PO CAPS
20.0000 mg | ORAL_CAPSULE | Freq: Every day | ORAL | Status: DC
  Administered 2017-12-18 – 2017-12-19 (×2): 20 mg via ORAL
  Filled 2017-12-17 (×2): qty 1

## 2017-12-17 MED ORDER — SODIUM CHLORIDE 0.9 % IV BOLUS
500.0000 mL | Freq: Once | INTRAVENOUS | Status: AC
  Administered 2017-12-17: 500 mL via INTRAVENOUS

## 2017-12-17 MED ORDER — FERROUS SULFATE 325 (65 FE) MG PO TABS
325.0000 mg | ORAL_TABLET | Freq: Every day | ORAL | Status: DC
  Administered 2017-12-18 – 2017-12-19 (×2): 325 mg via ORAL
  Filled 2017-12-17 (×2): qty 1

## 2017-12-17 MED ORDER — ACETAMINOPHEN 500 MG PO TABS
1000.0000 mg | ORAL_TABLET | Freq: Once | ORAL | Status: AC
  Administered 2017-12-17: 1000 mg via ORAL
  Filled 2017-12-17: qty 2

## 2017-12-17 MED ORDER — SODIUM CHLORIDE 0.9 % IV SOLN
INTRAVENOUS | Status: AC
  Filled 2017-12-17: qty 10

## 2017-12-17 MED ORDER — INSULIN ASPART 100 UNIT/ML ~~LOC~~ SOLN
0.0000 [IU] | Freq: Every day | SUBCUTANEOUS | Status: DC
  Filled 2017-12-17 (×2): qty 0.05

## 2017-12-17 MED ORDER — ENOXAPARIN SODIUM 30 MG/0.3ML ~~LOC~~ SOLN: 30.0000 mg | SUBCUTANEOUS | Status: DC

## 2017-12-17 MED ORDER — INSULIN ASPART 100 UNIT/ML ~~LOC~~ SOLN
0.0000 [IU] | Freq: Three times a day (TID) | SUBCUTANEOUS | Status: DC
  Administered 2017-12-18: 7 [IU] via SUBCUTANEOUS
  Administered 2017-12-18: 3 [IU] via SUBCUTANEOUS
  Filled 2017-12-17 (×3): qty 0.09
  Filled 2017-12-17 (×2): qty 1

## 2017-12-17 MED ORDER — ACETAMINOPHEN 650 MG RE SUPP: 650.0000 mg | Freq: Four times a day (QID) | RECTAL | Status: DC | PRN

## 2017-12-17 MED ORDER — METFORMIN HCL ER 500 MG PO TB24
500.0000 mg | ORAL_TABLET | Freq: Two times a day (BID) | ORAL | Status: DC
  Administered 2017-12-18 – 2017-12-19 (×3): 500 mg via ORAL
  Filled 2017-12-17 (×5): qty 1

## 2017-12-17 MED ORDER — LOPERAMIDE HCL 2 MG PO CAPS: 2.0000 mg | ORAL_CAPSULE | Freq: Four times a day (QID) | ORAL | Status: DC | PRN

## 2017-12-17 MED ORDER — CEFTRIAXONE SODIUM 1 G IJ SOLR
1.0000 g | INTRAMUSCULAR | Status: DC
  Administered 2017-12-18: 1 g via INTRAVENOUS
  Filled 2017-12-17: qty 10
  Filled 2017-12-17: qty 1

## 2017-12-17 MED ORDER — DIPHENHYDRAMINE HCL 25 MG PO CAPS
25.0000 mg | ORAL_CAPSULE | Freq: Every day | ORAL | Status: DC
  Administered 2017-12-18: 25 mg via ORAL
  Filled 2017-12-17: qty 1

## 2017-12-17 MED ORDER — ADULT MULTIVITAMIN W/MINERALS CH
ORAL_TABLET | Freq: Every day | ORAL | Status: DC
  Administered 2017-12-18 – 2017-12-19 (×2): 1 via ORAL
  Filled 2017-12-17 (×2): qty 1

## 2017-12-17 MED ORDER — RISAQUAD PO CAPS
1.0000 | ORAL_CAPSULE | Freq: Every day | ORAL | Status: DC
  Administered 2017-12-18 – 2017-12-19 (×2): 1 via ORAL
  Filled 2017-12-17 (×2): qty 1

## 2017-12-17 MED ORDER — INSULIN ASPART 100 UNIT/ML ~~LOC~~ SOLN
4.0000 [IU] | Freq: Once | SUBCUTANEOUS | Status: AC
  Administered 2017-12-17: 4 [IU] via SUBCUTANEOUS
  Filled 2017-12-17: qty 1

## 2017-12-17 MED ORDER — SODIUM CHLORIDE 0.9 % IV SOLN
1000.0000 mL | INTRAVENOUS | Status: DC
  Administered 2017-12-17: 1000 mL via INTRAVENOUS

## 2017-12-17 NOTE — ED Triage Notes (Signed)
PT arrived with son from [facility] with complaints of hyperglycemia. Facility reported blood glucose of 510 at 1045a and then at 1130a 490. Facility told family to take her to PCP and PCP said to come to ED because they didn't have any insulin. Son reports "when this usually happens she either has a UTI or she's found candy somewhere." Pt is a poor historian and has a history of dementia. Pt denies any pain.

## 2017-12-17 NOTE — ED Notes (Signed)
FIRST NURSE NOTE:  Pt arrived via POV with son with reports of elevated blood sugar of 490.  [facility] is from [facility]. Pt has hx of type 2 diabetes.

## 2017-12-17 NOTE — ED Notes (Signed)
First set of cultures sent to lab. Patient is extremely hard stick and has AMS and unable to tolerated repeated sticks. MD aware.

## 2017-12-17 NOTE — ED Notes (Signed)
Informed family that the patient would be roomed shortly to CPOD. Son was agreeable with plan. Denies any needs at this time.

## 2017-12-17 NOTE — H&P (Addendum)
[facility] [place] [facility]   PATIENT NAME: Glenda PartridgeCarolyn Langenderfer    MR#:  [id number]  DATE OF BIRTH:  02-26-1941  DATE OF ADMISSION:  12/17/2017  PRIMARY CARE PHYSICIAN: Jaclyn Shaggyate, Denny C, MD   REQUESTING/REFERRING PHYSICIAN: Dr. Willy EddyPatrick Robinson  CHIEF COMPLAINT:   Chief Complaint  Patient presents with  . Hyperglycemia    HISTORY OF PRESENT ILLNESS:  Glenda Vaughn  is a 77 y.o. female with a known history of dementia, depression, hypertension, NIDDM brought from assisted living facility for elevated blood sugars. Patient is not a great historian due to her dementia.  Most of the history obtained from son at bedside.  Patient is from assisted living facility but goes to [facility] 3 times a week for therapy.  She was noted to be lethargic and more confused than her baseline this morning.  When they checked her blood sugars, I was greater than 500. Patient is pleasantly confused, denies any complaints. Per family, her sugars are elevated when patient eats candy or when has an UTI. She spiked a fever of 101F in the ED. Labs showing significant UTI. No nausea, vomiting or abdominal pain. Patient denies any dysuria and frequency of urination.  PAST MEDICAL HISTORY:   Past Medical History:  Diagnosis Date  . Alzheimer's dementia   . Depression   . Diabetes mellitus without complication (HCC)   . Hyperlipidemia   . Hypertension   . Thyroid disease     PAST SURGICAL HISTORY:  History reviewed. No pertinent surgical history.  SOCIAL HISTORY:   Social History   Tobacco Use  . Smoking status: Never Smoker  . Smokeless tobacco: Never Used  Substance Use Topics  . Alcohol use: No    FAMILY HISTORY:   Family History  Problem Relation Age of Onset  . Prostate cancer Father     DRUG ALLERGIES:  No Known Allergies  REVIEW OF SYSTEMS:   Review of Systems  Unable to perform ROS: Dementia    MEDICATIONS AT HOME:   Prior to Admission medications     Medication Sig Start Date End Date Taking? Authorizing Provider  Ascorbic Acid (VITAMIN C) 500 MG CHEW Take 500 mg by mouth daily.   Yes [provider]  aspirin 81 MG chewable tablet Chew 81 mg by mouth daily.   Yes [provider]  Calcium Carb-Cholecalciferol 600-500 MG-UNIT CAPS Take 1 capsule by mouth daily.   Yes [provider]  Diphenhydramine-APAP, sleep, (TYLENOL PM EXTRA STRENGTH PO) Take 1 tablet by mouth every evening.   Yes [provider]  donepezil (ARICEPT) 10 MG tablet Take 10 mg by mouth daily.   Yes [provider]  ferrous sulfate 325 (65 FE) MG tablet Take 1 tablet (325 mg total) daily with breakfast by mouth. 04/29/17  Yes Wieting, Richard, MD  FLUoxetine (PROZAC) 20 MG capsule Take 20 mg by mouth daily.   Yes [provider]  glimepiride (AMARYL) 4 MG tablet Take 4 mg by mouth 2 (two) times daily.    Yes [provider]  levothyroxine (SYNTHROID, LEVOTHROID) 125 MCG tablet Take 125 mcg by mouth every morning.    Yes [provider]  loperamide (IMODIUM) 2 MG capsule Take 2 mg by mouth every 6 (six) hours as needed for diarrhea or loose stools.    Yes [provider]  memantine (NAMENDA) 10 MG tablet Take 10 mg by mouth 2 (two) times daily.   Yes [provider]  metFORMIN (  GLUCOPHAGE-XR) 500 MG 24 hr tablet Take 500 mg by mouth 2 (two) times daily.   Yes [provider]  Mouthwashes (LISTERINE ZERO) LIQD Use as directed in the mouth or throat 2 (two) times daily.   Yes [provider]  Multiple Vitamins-Minerals (CERTAVITE SENIOR/ANTIOXIDANT) TABS Take 1 tablet by mouth daily.   Yes [provider]  naproxen sodium (ANAPROX) 220 MG tablet Take 220 mg by mouth 2 (two) times daily with a meal.   Yes [provider]  Probiotic Product (ALIGN) 4 MG CAPS Take 4 mg by mouth daily.   Yes [provider]  cephALEXin (KEFLEX) 500 MG capsule Take 1  capsule (500 mg total) by mouth 2 (two) times daily. Patient not taking: Reported on 12/17/2017 11/09/17   Jene Every, MD      VITAL SIGNS:  Blood pressure (!) 149/73, pulse 90, temperature (!) 101.4 F (38.6 C), temperature source Rectal, resp. rate 18, height 5\' 3"  (1.6 m), weight 56.7 kg (125 lb), SpO2 99 %.  PHYSICAL EXAMINATION:   Physical Exam  GENERAL:  77 y.o.-year-old elderly patient lying in the bed with no acute distress.  EYES: Pupils equal, round, reactive to light and accommodation. No scleral icterus. Extraocular muscles intact.  HEENT: Head atraumatic, normocephalic. Oropharynx and nasopharynx clear.  NECK:  Supple, no jugular venous distention. No thyroid enlargement, no tenderness.  LUNGS: Normal breath sounds bilaterally, no wheezing, rales,rhonchi or crepitation. No use of accessory muscles of respiration. Decreased bibasilar breath sounds CARDIOVASCULAR: S1, S2 normal. No  rubs, or gallops. 3/6 systolic murmur present ABDOMEN: Soft, nontender, nondistended. Bowel sounds present. No organomegaly or mass.  EXTREMITIES: No pedal edema, cyanosis, or clubbing.  NEUROLOGIC: Cranial nerves II through XII are intact. Muscle strength 5/5 in all extremities. Sensation intact. Gait not checked.  PSYCHIATRIC: The patient is alert and oriented to self.  SKIN: No obvious rash, lesion, or ulcer.   LABORATORY PANEL:   CBC Recent Labs  Lab 12/17/17 1250  WBC 10.7  HGB 10.8*  HCT 30.7*  PLT 426   ------------------------------------------------------------------------------------------------------------------  Chemistries  Recent Labs  Lab 12/17/17 1250  NA 139  K 3.9  CL 102  CO2 25  GLUCOSE 332*  BUN 27*  CREATININE 1.36*  CALCIUM 9.6   ------------------------------------------------------------------------------------------------------------------  Cardiac Enzymes No results for input(s): TROPONINI in the last 168  hours. ------------------------------------------------------------------------------------------------------------------  RADIOLOGY:  Dg Chest Port 1 View  Result Date: 12/17/2017 CLINICAL DATA:  Hyperglycemia and hypertension.  Fever. EXAM: PORTABLE CHEST 1 VIEW COMPARISON:  April 11, 2017 FINDINGS: There is minimal bibasilar scarring. There is no edema or consolidation. Heart size and pulmonary vascularity are normal. No adenopathy. No evident bone lesions. IMPRESSION: Slight bibasilar scarring. No edema or consolidation. Heart size normal. Electronically Signed   By: Bretta Bang III M.D.   On: 12/17/2017 18:25    EKG:   Orders placed or performed during the hospital encounter of 04/11/17  . EKG 12-Lead  . EKG 12-Lead  . EKG    IMPRESSION AND PLAN:   Glenda Vaughn  is a 77 y.o. female with a known history of dementia, depression, hypertension, NIDDM brought from assisted living facility for elevated blood sugars.  1. Acute cystitis- blood and urine cultures sent for - From May 2019- growing non ESBL E.coli sensitive to rocephin- so will start rocephin for now - fluids and monitor  2. Uncontrolled type 2 DM- continue glimiperide and metformin - SSI, check a1c  3. CKD stage3- stable, slight  worsening noted. Hold naproxen and give tramadol for her pain  4. Dementia, depression- at baseline - continue home meds  5. Hypothyroidism- on synthroid  6. DVT Prophylaxis- lovenox  PT consult and social worker consult    All the records are reviewed and case discussed with ED provider. Management plans discussed with the patient, family and they are in agreement.  CODE STATUS: DNR  TOTAL TIME TAKING CARE OF THIS PATIENT: 50 minutes.    Enid Baas M.D on 12/17/2017 at 6:32 PM  Between 7am to 6pm - Pager - [phone]  After 6pm go to [email or web address] - Social research officer, government  [facility]  Office  [phone]  CC: Primary care physician; Jaclyn Shaggy, MD

## 2017-12-17 NOTE — ED Notes (Signed)
Ambulated to BR with assist. Tolerated well.

## 2017-12-17 NOTE — Progress Notes (Signed)
   [facility] - [place] at [facility]   Advance care planning  Hospital Day: 0 days Glenda PartridgeCarolyn Vaughn is a 77 y.o. female presenting with Hyperglycemia .   Advance care planning discussed with patient's son at bedside. Patient not able to participate in the conversation due to her dementia. All questions in regards to overall condition and expected prognosis answered. The decision was made to continue current code status.  CODE STATUS: Do Not Resuscitate Time spent: 18 minutes

## 2017-12-17 NOTE — ED Notes (Signed)
Ambulated with assist to BR.  Tolerated well.  NAD

## 2017-12-17 NOTE — ED Notes (Signed)
Pt in NAD. cbg 237

## 2017-12-17 NOTE — ED Provider Notes (Signed)
[facility] Emergency Department Provider Note    First MD Initiated Contact with Patient 12/17/17 1404     (approximate)  I have reviewed the triage vital signs and the nursing notes.   HISTORY  Chief Complaint Hyperglycemia    HPI Glenda Vaughn is a 77 y.o. female with a history of all times dementia as well as diabetes presents from [facility] for elevated blood sugar.  Son states that when this happens in the past this is either typically related to urinary tract infection or if the patient was able to stumble upon some sweets and candy.  Patient otherwise asymptomatic.  Try to follow-up with PCP who directed her to the ER.  Patient has no complaints with underlying dementia.  Son states he is otherwise acting and behaving at her normal.  Blood sugar already improving upon arrival to the ER.   Past Medical History:  Diagnosis Date  . Alzheimer's dementia   . Depression   . Diabetes mellitus without complication (HCC)   . Hyperlipidemia   . Hypertension   . Thyroid disease    Family History  Problem Relation Age of Onset  . Prostate cancer Father    History reviewed. No pertinent surgical history. Patient Active Problem List   Diagnosis Date Noted  . Encephalopathy acute 04/25/2017  . Sepsis (HCC) 04/11/2017  . Elevated troponin 04/04/2016  . Fall 04/04/2016  . Alzheimer's dementia 04/04/2016  . Generalized weakness 04/04/2016  . Acute cystitis without hematuria 04/04/2016  . Iron deficiency anemia 04/04/2016  . Hyponatremia 04/04/2016  . Mild aortic stenosis 04/04/2016  . Urinary tract infection with hematuria       Prior to Admission medications   Medication Sig Start Date End Date Taking? Authorizing Provider  Ascorbic Acid (VITAMIN C) 500 MG CHEW Take 500 mg by mouth daily.    [provider]  aspirin 81 MG chewable tablet Chew 81 mg by mouth daily.    [provider]  cephALEXin (KEFLEX) 500 MG capsule Take  1 capsule (500 mg total) by mouth 2 (two) times daily. 11/09/17   Jene Every, MD  Diphenhydramine-APAP, sleep, (TYLENOL PM EXTRA STRENGTH PO) Take 1 tablet by mouth every evening.    [provider]  donepezil (ARICEPT) 10 MG tablet Take 10 mg by mouth daily.    [provider]  ferrous sulfate 325 (65 FE) MG tablet Take 1 tablet (325 mg total) daily with breakfast by mouth. 04/29/17   Renae Gloss, Richard, MD  FLUoxetine (PROZAC) 20 MG capsule Take 20 mg by mouth daily.    [provider]  glimepiride (AMARYL) 4 MG tablet Take 4 mg by mouth 2 (two) times daily.     [provider]  levothyroxine (SYNTHROID, LEVOTHROID) 125 MCG tablet Take 125 mcg by mouth every morning.     [provider]  loperamide (IMODIUM) 2 MG capsule Take 2 mg by mouth as needed for diarrhea or loose stools.    [provider]  memantine (NAMENDA) 10 MG tablet Take 10 mg by mouth 2 (two) times daily.    [provider]  metFORMIN (GLUCOPHAGE-XR) 500 MG 24 hr tablet Take 500 mg by mouth 2 (two) times daily.    [provider]  Multiple Vitamin (MULTIVITAMIN) tablet Take 1 tablet by mouth daily.    [provider]  Multiple Vitamins-Minerals (CERTAVITE SENIOR/ANTIOXIDANT) TABS Take 1 tablet by mouth daily.    [provider]  naproxen sodium (ANAPROX) 220  MG tablet Take 220 mg by mouth 2 (two) times daily with a meal.    [provider]  Probiotic Product (ALIGN) 4 MG CAPS Take 4 mg by mouth daily.    [provider]    Allergies Patient has no known allergies.    Social History Social History   Tobacco Use  . Smoking status: Never Smoker  . Smokeless tobacco: Never Used  Substance Use Topics  . Alcohol use: No  . Drug use: No    Review of Systems Patient denies headaches, rhinorrhea, blurry vision, numbness, shortness of breath, chest pain, edema, cough, abdominal pain, nausea, vomiting, diarrhea,  dysuria, fevers, rashes or hallucinations unless otherwise stated above in HPI. ____________________________________________   PHYSICAL EXAM:  VITAL SIGNS: Vitals:   12/17/17 1749 12/17/17 1750  BP:  (!) 149/73  Pulse:  90  Resp:  18  Temp: (!) 101.4 F (38.6 C)   SpO2:  99%    Constitutional: Alert  Eyes: Conjunctivae are normal.  Head: Atraumatic. Nose: No congestion/rhinnorhea. Mouth/Throat: Mucous membranes are moist.   Neck: No stridor. Painless ROM.  Cardiovascular: Normal rate, regular rhythm. Grossly normal heart sounds.  Good peripheral circulation. Respiratory: Normal respiratory effort.  No retractions. Lungs CTAB. Gastrointestinal: Soft and nontender. No distention. No abdominal bruits. No CVA tenderness. Genitourinary: deferred Musculoskeletal: No lower extremity tenderness nor edema.  No joint effusions. Neurologic:  . No gross focal neurologic deficits are appreciated. No facial droop Skin:  Skin is warm, dry and intact. No rash noted. Psychiatric: Mood and affect are normal. Speech and behavior are normal.  ____________________________________________   LABS (all labs ordered are listed, but only abnormal results are displayed)  Results for orders placed or performed during the hospital encounter of 12/17/17 (from the past 24 hour(s))  Glucose, capillary     Status: Abnormal   Collection Time: 12/17/17 12:42 PM  Result Value Ref Range   Glucose-Capillary 313 (H) 70 - 99 mg/dL  Basic metabolic panel     Status: Abnormal   Collection Time: 12/17/17 12:50 PM  Result Value Ref Range   Sodium 139 135 - 145 mmol/L   Potassium 3.9 3.5 - 5.1 mmol/L   Chloride 102 98 - 111 mmol/L   CO2 25 22 - 32 mmol/L   Glucose, Bld 332 (H) 70 - 99 mg/dL   BUN 27 (H) 8 - 23 mg/dL   Creatinine, Ser 1.611.36 (H) 0.44 - 1.00 mg/dL   Calcium 9.6 8.9 - 09.610.3 mg/dL   GFR calc non Af Amer 37 (L) >60 mL/min   GFR calc Af Amer 43 (L) >60 mL/min   Anion gap 12 5 - 15  CBC     Status:  Abnormal   Collection Time: 12/17/17 12:50 PM  Result Value Ref Range   WBC 10.7 3.6 - 11.0 K/uL   RBC 3.60 (L) 3.80 - 5.20 MIL/uL   Hemoglobin 10.8 (L) 12.0 - 16.0 g/dL   HCT 04.530.7 (L) 40.935.0 - 81.147.0 %   MCV 85.3 80.0 - 100.0 fL   MCH 30.1 26.0 - 34.0 pg   MCHC 35.3 32.0 - 36.0 g/dL   RDW 91.417.4 (H) 78.211.5 - 95.614.5 %   Platelets 426 150 - 440 K/uL  Glucose, capillary     Status: Abnormal   Collection Time: 12/17/17  2:10 PM  Result Value Ref Range   Glucose-Capillary 237 (H) 70 - 99 mg/dL  Urinalysis, Complete w Microscopic     Status: Abnormal   Collection Time:  12/17/17  3:38 PM  Result Value Ref Range   Color, Urine AMBER (A) YELLOW   APPearance TURBID (A) CLEAR   Specific Gravity, Urine 1.018 1.005 - 1.030   pH 5.0 5.0 - 8.0   Glucose, UA >=500 (A) NEGATIVE mg/dL   Hgb urine dipstick LARGE (A) NEGATIVE   Bilirubin Urine NEGATIVE NEGATIVE   Ketones, ur NEGATIVE NEGATIVE mg/dL   Protein, ur 161 (A) NEGATIVE mg/dL   Nitrite POSITIVE (A) NEGATIVE   Leukocytes, UA LARGE (A) NEGATIVE   RBC / HPF >50 (H) 0 - 5 RBC/hpf   WBC, UA >50 (H) 0 - 5 WBC/hpf   Bacteria, UA MANY (A) NONE SEEN   Squamous Epithelial / LPF NONE SEEN 0 - 5   WBC Clumps PRESENT   Glucose, capillary     Status: Abnormal   Collection Time: 12/17/17  5:40 PM  Result Value Ref Range   Glucose-Capillary 186 (H) 70 - 99 mg/dL   ____________________________________________ ____________________________________________  RADIOLOGY I personally reviewed all radiographic images ordered to evaluate for the above acute complaints and reviewed radiology reports and findings.  These findings were personally discussed with the patient.  Please see medical record for radiology report.  ____________________________________________   PROCEDURES  Procedure(s) performed:  Procedures    Critical Care performed: no ____________________________________________   INITIAL IMPRESSION / ASSESSMENT AND PLAN / ED  COURSE  Pertinent labs & imaging results that were available during my care of the patient were reviewed by me and considered in my medical decision making (see chart for details).   DDX: dka, uti, hhns, pyelo, noncompliance  SHAKERRIA PARRAN is a 77 y.o. who presents to the ED with symptoms as described above.  Patient currently afebrile and normotensive.  Patient does have evidence of hyperglycemia without evidence of metabolic acidosis.  Patients blood sugar is coming down naturally therefore do suspect the patient might of gotten into some sweets however patient does have extensive history with previous admissions to hospital for urosepsis therefore will further evaluate.  Patient does have underlying leukocytosis.  The patient will be placed on continuous pulse oximetry and telemetry for monitoring.  Laboratory evaluation will be sent to evaluate for the above complaints.     Clinical Course as of Dec 17 1798  Tue Dec 17, 2017  1608 Urine does show evidence of UTI or nitrite positive.  On review of her previous records patient does not have a history of ESBL in previous urine culture from this year did show that she was sensitive to Rocephin.  We will give IV dose of Rocephin and IV fluids and reassess.   [PR]  1736 Tollerating oral hydration.  However patient is requiring significant assistance with ambulation and very unsteady on her feet.  Patient also noted to be shivering and found to be with new onset fever.  Based on her UTI comorbidities, hyperglycemia requiring IV fluids as well as insulin I do feel patient would benefit from hospitalization for further hemodynamic monitoring and rule out sepsis.  She remains normotensive.  Will give Tylenol for fever.  Will discuss case with hospitalist for admission.   [PR]    Clinical Course User Index [PR] Willy Eddy, MD     As part of my medical decision making, I reviewed the following data within the electronic MEDICAL RECORD NUMBER  Nursing notes reviewed and incorporated, Labs reviewed, notes from prior ED visits.   ____________________________________________   FINAL CLINICAL IMPRESSION(S) / ED DIAGNOSES  Final diagnoses:  Acute cystitis with hematuria  Hyperglycemia      NEW MEDICATIONS STARTED DURING THIS VISIT:  New Prescriptions   No medications on file     Note:  This document was prepared using Dragon voice recognition software and may include unintentional dictation errors.    Willy Eddy, MD 12/17/17 [phone]

## 2017-12-18 ENCOUNTER — Other Ambulatory Visit: Payer: Self-pay

## 2017-12-18 LAB — BASIC METABOLIC PANEL
Anion gap: 9 (ref 5–15)
BUN: 24 mg/dL — AB (ref 8–23)
CHLORIDE: 107 mmol/L (ref 98–111)
CO2: 23 mmol/L (ref 22–32)
Calcium: 8.3 mg/dL — ABNORMAL LOW (ref 8.9–10.3)
Creatinine, Ser: 1.01 mg/dL — ABNORMAL HIGH (ref 0.44–1.00)
GFR calc Af Amer: >60 mL/min (ref >60)
GFR calc non Af Amer: 53 mL/min — ABNORMAL LOW (ref >60)
GLUCOSE: 218 mg/dL — AB (ref 70–99)
POTASSIUM: 3.8 mmol/L (ref 3.5–5.1)
SODIUM: 139 mmol/L (ref 135–145)

## 2017-12-18 LAB — CBC
HEMATOCRIT: 26.7 % — AB (ref 35.0–47.0)
Hemoglobin: 9.1 g/dL — ABNORMAL LOW (ref 12.0–16.0)
MCH: 29.1 pg (ref 26.0–34.0)
MCHC: 34 g/dL (ref 32.0–36.0)
MCV: 85.4 fL (ref 80.0–100.0)
Platelets: 360 10*3/uL (ref 150–440)
RBC: 3.12 MIL/uL — ABNORMAL LOW (ref 3.80–5.20)
RDW: 16.9 % — AB (ref 11.5–14.5)
WBC: 10.8 10*3/uL (ref 3.6–11.0)

## 2017-12-18 LAB — GLUCOSE, CAPILLARY
GLUCOSE-CAPILLARY: 208 mg/dL — AB (ref 70–99)
GLUCOSE-CAPILLARY: 119 mg/dL — AB (ref 70–99)
Glucose-Capillary: 301 mg/dL — ABNORMAL HIGH (ref 70–99)
Glucose-Capillary: 72 mg/dL (ref 70–99)

## 2017-12-18 LAB — HEMOGLOBIN A1C
Hgb A1c MFr Bld: 7.8 % — ABNORMAL HIGH (ref 4.8–5.6)
Mean Plasma Glucose: 177.16 mg/dL

## 2017-12-18 LAB — MRSA PCR SCREENING: MRSA by PCR: NEGATIVE

## 2017-12-18 MED ORDER — INSULIN GLARGINE 100 UNIT/ML ~~LOC~~ SOLN
12.0000 [IU] | Freq: Every day | SUBCUTANEOUS | Status: DC
Start: 2017-12-18 — End: 2017-12-19
  Administered 2017-12-18 – 2017-12-19 (×2): 12 [IU] via SUBCUTANEOUS
  Filled 2017-12-18 (×2): qty 0.12

## 2017-12-18 MED ORDER — ENOXAPARIN SODIUM 40 MG/0.4ML ~~LOC~~ SOLN
40.0000 mg | SUBCUTANEOUS | Status: DC
  Filled 2017-12-18: qty 0.4

## 2017-12-18 NOTE — Progress Notes (Signed)
PT Cancellation Note  Patient Details Name: Glenda AlexanderCarolyn L Vaughn MRN: [id number] DOB: Jan 04, 1941   Cancelled Treatment:    Reason Eval/Treat Not Completed: Patient declined, no reason specified.  Order received.  Chart reviewed.  Pt refused, stating that she does not feel well enough to get OOB and walk at this time.  Pt agreeable to try later today or tomorrow if appropriate.  PT confirmed this with RN.  Will re-attempt later if time allows.   Glenetta HewSarah Berwyn Bigley, PT, DPT 12/18/2017, 10:36 AM

## 2017-12-18 NOTE — Care Management CC44 (Signed)
Condition Code 44 Documentation Completed  Patient Details  Name: Glenda AlexanderCarolyn L Vaughn MRN: [id number] Date of Birth: 01-16-1941   Condition Code 44 given:  Yes Patient signature on Condition Code 44 notice:  Yes Documentation of 2 MD's agreement:  Yes Code 44 added to claim:  Yes    Chapman FitchBOWEN, Tondra Reierson T, RN 12/18/2017, 2:46 PM

## 2017-12-18 NOTE — Evaluation (Signed)
Physical Therapy Evaluation Patient Details Name: Glenda Vaughn MRN: [id number] DOB: Jan 24, 1941 Today's Date: 12/18/2017   History of Present Illness  Patient is 77 yo female admitted for elevated blood sugar. PMH of Alzheimers, depression, diabetes, hyperlipidemia, HTN, thyroid disease. Per chart review and son patient is from assisted living facility ([facility]) and recieves PT 3x a week at [facility].   Clinical Impression    Patient alert and oriented to self, disoriented to time place, situation. Per son, patient has Alzheimers and is altered at baseline, but believes she is more disoriented than usual. Son states that patient lives at [facility] living, where patient is assisted with cooking, cleaning, bathing, but is independent with ambulation in room. States that patient refuses to use walker. Upon assessment patient demonstrates sit <> stand transfers with CGA/min Ax1, bed mobility with CGA, and ambulated 26ft with no device/hand held assist/CGA and cues to attend to task throughout session.The patient would benefit from further skilled PT to address the change from PLOF including gait and balance deficits, decreased activity tolerance and decreased safety awareness.    Follow Up Recommendations SNF    Equipment Recommendations  None recommended by PT    Recommendations for Other Services       Precautions / Restrictions Precautions Precautions: Fall Restrictions Weight Bearing Restrictions: No      Mobility  Bed Mobility Overal bed mobility: Needs Assistance Bed Mobility: Sit to Supine       Sit to supine: Min guard      Transfers Overall transfer level: Needs assistance Equipment used: 1 person hand held assist Transfers: Sit to/from Stand Sit to Stand: Min guard;Min assist         General transfer comment: Patient on commode at start of session, patient needed multimodal cues to initate sit to stand, Min Ax1 from low surface. Patient able to  stand from EOB later in session with CGA, and able to take shuffling steps towards head of bed.  Ambulation/Gait Ambulation/Gait assistance: Min guard Gait Distance (Feet): 10 Feet Assistive device: IV Pole;None;1 person hand held assist       General Gait Details: Patient ambulation unsteady, uneven cadence, reaching for support, used IV pole and then wall, and hand held assist.   Stairs            Wheelchair Mobility    Modified Rankin (Stroke Patients Only)       Balance Overall balance assessment: Needs assistance Sitting-balance support: Feet supported;Single extremity supported Sitting balance-Leahy Scale: Poor       Standing balance-Leahy Scale: Poor                               Pertinent Vitals/Pain Pain Assessment: No/denies pain    Home Living Family/patient expects to be discharged to:: Assisted living               Home Equipment: Walker - 2 wheels Additional Comments: Per family report and patient, patient refuses to use walker.    Prior Function Level of Independence: Needs assistance   Gait / Transfers Assistance Needed: Per son, patient ambulates independently in home, refuses to use walker  ADL's / Homemaking Assistance Needed: assist from care staff for cooking, cleaning, bathing, son not sure if patient dresses herself, patient states she does.  Comments: Patient's son reports that his sister is primary contact usually for patient, sister is currently out of town.  Hand Dominance   Dominant Hand: Right    Extremity/Trunk Assessment   Upper Extremity Assessment Upper Extremity Assessment: RUE deficits/detail;LUE deficits/detail;Defer to OT evaluation RUE Deficits / Details: 3+/5 LUE Deficits / Details: 3+/5    Lower Extremity Assessment Lower Extremity Assessment: Generalized weakness;RLE deficits/detail;LLE deficits/detail;Difficult to assess due to impaired cognition RLE Deficits / Details: 3/5 LLE  Deficits / Details: 3/5    Cervical / Trunk Assessment Cervical / Trunk Assessment: Kyphotic  Communication   Communication: Expressive difficulties  Cognition Arousal/Alertness: Awake/alert Behavior During Therapy: Flat affect Overall Cognitive Status: Impaired/Different from baseline Area of Impairment: Orientation;Attention;Memory                 Orientation Level: Disoriented to;Place;Time;Situation Current Attention Level: Selective Memory: Decreased short-term memory         General Comments: Per son, patient seems altered from baseline. At baseline patient has cognitive impairments (Alzheimers) but son does believe she is more altered than usual.       General Comments      Exercises     Assessment/Plan    PT Assessment Patient needs continued PT services  PT Problem List Decreased strength;Decreased cognition;Decreased activity tolerance;Decreased knowledge of use of DME;Decreased balance;Decreased safety awareness;Decreased mobility       PT Treatment Interventions DME instruction;Therapeutic exercise;Gait training;Balance training;Neuromuscular re-education;Therapeutic activities;Patient/family education;Functional mobility training    PT Goals (Current goals can be found in the Care Plan section)  Acute Rehab PT Goals Patient Stated Goal: Son just wants patient to be safe and return to PLOF PT Goal Formulation: Patient unable to participate in goal setting Time For Goal Achievement: 01/01/18 Potential to Achieve Goals: Good    Frequency Min 2X/week   Barriers to discharge        Co-evaluation               AM-PAC PT "6 Clicks" Daily Activity  Outcome Measure Difficulty turning over in bed (including adjusting bedclothes, sheets and blankets)?: A Little Difficulty moving from lying on back to sitting on the side of the bed? : Unable Difficulty sitting down on and standing up from a chair with arms (e.g., wheelchair, bedside commode,  etc,.)?: Unable Help needed moving to and from a bed to chair (including a wheelchair)?: A Little Help needed walking in hospital room?: A Lot Help needed climbing 3-5 steps with a railing? : Total 6 Click Score: 11    End of Session Equipment Utilized During Treatment: Gait belt Activity Tolerance: Other (comment);Patient limited by fatigue(session limited by cognitive function) Patient left: in bed;with call bell/phone within reach;with bed alarm set;with family/visitor present Nurse Communication: Mobility status PT Visit Diagnosis: Unsteadiness on feet (R26.81);Other abnormalities of gait and mobility (R26.89);Muscle weakness (generalized) (M62.81)    Time: [id number] PT Time Calculation (min) (ACUTE ONLY): 31 min   Charges:   PT Evaluation $PT Eval Low Complexity: 1 Low PT Treatments $Therapeutic Activity: 8-22 mins   PT G Codes:        Olga Coasteriana Karlisa Gaubert PT, DPT 4:33 PM,12/18/17 [phone]

## 2017-12-18 NOTE — Progress Notes (Signed)
[facility] [place] [facility]   PATIENT NAME: Glenda PartridgeCarolyn Vaughn    MR#:  [id number]  DATE OF BIRTH:  01/19/1941  SUBJECTIVE:  CHIEF COMPLAINT:   Chief Complaint  Patient presents with  . Hyperglycemia   Patient is pleasantly confused.  REVIEW OF SYSTEMS:    Review of Systems  Unable to perform ROS: Dementia    DRUG ALLERGIES:  No Known Allergies  VITALS:  Blood pressure 118/67, pulse 96, temperature 98.7 F (37.1 C), temperature source Oral, resp. rate 16, height 5\' 6"  (1.676 m), weight 61.1 kg (134 lb 11.2 oz), SpO2 95 %.  PHYSICAL EXAMINATION:   Physical Exam  GENERAL:  77 y.o.-year-old patient lying in the bed with no acute distress.  EYES: Pupils equal, round, reactive to light and accommodation. No scleral icterus. Extraocular muscles intact.  HEENT: Head atraumatic, normocephalic. Oropharynx and nasopharynx clear.  NECK:  Supple, no jugular venous distention. No thyroid enlargement, no tenderness.  LUNGS: Normal breath sounds bilaterally, no wheezing, rales, rhonchi. No use of accessory muscles of respiration.  CARDIOVASCULAR: S1, S2 normal. No murmurs, rubs, or gallops.  ABDOMEN: Soft, nontender, nondistended. Bowel sounds present. No organomegaly or mass.  EXTREMITIES: No cyanosis, clubbing or edema b/l.    NEUROLOGIC: Cranial nerves II through XII are intact. No focal Motor or sensory deficits b/l.   PSYCHIATRIC: The patient is alert and awake SKIN: No obvious rash, lesion, or ulcer.   LABORATORY PANEL:   CBC Recent Labs  Lab 12/18/17 0443  WBC 10.8  HGB 9.1*  HCT 26.7*  PLT 360   ------------------------------------------------------------------------------------------------------------------ Chemistries  Recent Labs  Lab 12/18/17 0443  NA 139  K 3.8  CL 107  CO2 23  GLUCOSE 218*  BUN 24*  CREATININE 1.01*  CALCIUM 8.3*    ------------------------------------------------------------------------------------------------------------------  Cardiac Enzymes No results for input(s): TROPONINI in the last 168 hours. ------------------------------------------------------------------------------------------------------------------  RADIOLOGY:  Dg Chest Port 1 View  Result Date: 12/17/2017 CLINICAL DATA:  Hyperglycemia and hypertension.  Fever. EXAM: PORTABLE CHEST 1 VIEW COMPARISON:  April 11, 2017 FINDINGS: There is minimal bibasilar scarring. There is no edema or consolidation. Heart size and pulmonary vascularity are normal. No adenopathy. No evident bone lesions. IMPRESSION: Slight bibasilar scarring. No edema or consolidation. Heart size normal. Electronically Signed   By: Bretta BangWilliam  Woodruff III M.D.   On: 12/17/2017 18:25     ASSESSMENT AND PLAN:   Glenda PartridgeCarolyn Vaughn  is a 77 y.o. female with a known history of dementia, depression, hypertension, NIDDM brought from assisted living facility for elevated blood sugars.  1. Acute cystitis Urine and blood cultures pending IV ceftriaxone  2. Uncontrolled type 2 DM with hyperglycemia likely due to acute infection - continue glimiperide and metformin - SSI, check a1c   3. CKD stage3 Stable  4. Dementia, depression- at baseline - continue home meds  5. Hypothyroidism- on synthroid  6. DVT Prophylaxis- lovenox  All the records are reviewed and case discussed with Care Management/Social Workerr. Management plans discussed with the patient, family and they are in agreement.  CODE STATUS: DNR  DVT Prophylaxis: SCDs  TOTAL TIME TAKING CARE OF THIS PATIENT: 35 minutes.   POSSIBLE D/C IN 1-2 DAYS, DEPENDING ON CLINICAL CONDITION.  Molinda BailiffSrikar R Nelma Phagan M.D on 12/18/2017 at 11:45 AM  Between 7am to 6pm - Pager - [phone]  After 6pm go to [email or web address] - password [email or web address] [facility]  [facility]  Office  [phone]  CC: Primary care  physician; Jaclyn Shaggyate, Denny C,  MD  Note: This dictation was prepared with Dragon dictation along with smaller phrase technology. Any transcriptional errors that result from this process are unintentional.

## 2017-12-18 NOTE — Care Management Obs Status (Signed)
MEDICARE OBSERVATION STATUS NOTIFICATION   Patient Details  Name: Glenda Vaughn MRN: [id number] Date of Birth: Aug 08, 1940   Medicare Observation Status Notification Given:  Yes    Chapman FitchBOWEN, Fedra Lanter T, RN 12/18/2017, 2:46 PM

## 2017-12-18 NOTE — Progress Notes (Signed)
Inpatient Diabetes Program Recommendations  AACE/ADA: New Consensus Statement on Inpatient Glycemic Control (2015)  Target Ranges:  Prepandial:   less than 140 mg/dL      Peak postprandial:   less than 180 mg/dL (1-2 hours)      Critically ill patients:  140 - 180 mg/dL   Lab Results  Component Value Date   GLUCAP 301 (H) 12/18/2017   HGBA1C 7.8 (H) 12/17/2017    Review of Glycemic Control Results for Glenda Vaughn, Glenda Vaughn (MRN [id number]) as of 12/18/2017 12:02  Ref. Range 12/17/2017 17:40 12/17/2017 21:01 12/18/2017 07:39 12/18/2017 11:48  Glucose-Capillary Latest Ref Range: 70 - 99 mg/dL 782186 (H) 956307 (H) 213208 (H) 301 (H)   Diabetes history: Type 2 DM Outpatient Diabetes medications: Amaryl 4 mg BID, Metformin 500 mg BID Current orders for Inpatient glycemic control: Amaryl 4 mg BID, Metformin 500 mg BID, Novolog 0-9 units TID, Novolog 0-5 units QHS  Inpatient Diabetes Program Recommendations:    In the setting of infection, may want to consider adding Lantus 12 units QHS.  If post prandials continue to exceed 180 mg/dL, consider adding Novolog 3 units TID (assuming that patient is eating >50%).   Thanks, Lujean RaveLauren Madalene Mickler, MSN, RNC-OB Diabetes Coordinator [phone] (8a-5p)

## 2017-12-18 NOTE — Progress Notes (Signed)
Anticoagulation monitoring(Lovenox):  77yo  female ordered Lovenox 30 mg Q24h  Filed Weights   12/17/17 1239 12/17/17 2050  Weight: 125 lb (56.7 kg) 134 lb 11.2 oz (61.1 kg)   Body mass index is 21.74 kg/m.    Lab Results  Component Value Date   CREATININE 1.01 (H) 12/18/2017   CREATININE 1.36 (H) 12/17/2017   CREATININE 1.18 (H) 11/09/2017   Estimated Creatinine Clearance: 44.4 mL/min (A) (by C-G formula based on SCr of 1.01 mg/dL (H)). Hemoglobin & Hematocrit     Component Value Date/Time   HGB 9.1 (L) 12/18/2017 0443   HCT 26.7 (L) 12/18/2017 0443     Per Protocol for Patient with estCrcl > 30 ml/min and BMI < 40, will transition to Lovenox 40 mg Q24h.

## 2017-12-19 LAB — GLUCOSE, CAPILLARY: Glucose-Capillary: 83 mg/dL (ref 70–99)

## 2017-12-19 MED ORDER — NITROFURANTOIN MONOHYD MACRO 100 MG PO CAPS
100.0000 mg | ORAL_CAPSULE | Freq: Two times a day (BID) | ORAL | Status: DC
Start: 2017-12-19 — End: 2017-12-19
  Administered 2017-12-19: 100 mg via ORAL
  Filled 2017-12-19 (×2): qty 1

## 2017-12-19 MED ORDER — NITROFURANTOIN MONOHYD MACRO 100 MG PO CAPS: 100.0000 mg | ORAL_CAPSULE | Freq: Two times a day (BID) | ORAL | 0 refills | Status: AC

## 2017-12-19 MED ORDER — BLOOD GLUCOSE MONITOR KIT: PACK | 0 refills | Status: AC

## 2017-12-19 MED ORDER — INSULIN GLARGINE 100 UNIT/ML ~~LOC~~ SOLN: 12.0000 [IU] | Freq: Every day | SUBCUTANEOUS | 0 refills | Status: AC

## 2017-12-19 MED ORDER — METFORMIN HCL ER 500 MG PO TB24: 500.0000 mg | ORAL_TABLET | Freq: Three times a day (TID) | ORAL | 0 refills | Status: AC

## 2017-12-19 MED ORDER — INSULIN ASPART 100 UNIT/ML ~~LOC~~ SOLN: 0.0000 [IU] | Freq: Three times a day (TID) | SUBCUTANEOUS | 11 refills | Status: DC

## 2017-12-19 MED ORDER — INSULIN ASPART 100 UNIT/ML ~~LOC~~ SOLN: 0.0000 [IU] | Freq: Every day | SUBCUTANEOUS | 0 refills | Status: DC

## 2017-12-19 NOTE — Progress Notes (Signed)
Nsg Discharge Note  Admit Date:  12/17/2017 Discharge date: 12/19/2017   LYNSAY FESPERMAN to be D/C'd to [facility] per MD order.  AVS completed.  Copy for chart, and copy for patient signed, and dated. Patient/caregiver able to verbalize understanding.  Discharge Medication: Allergies as of 12/19/2017   No Known Allergies     Medication List    STOP taking these medications   cephALEXin 500 MG capsule Commonly known as:  KEFLEX     TAKE these medications   ALIGN 4 MG Caps Take 4 mg by mouth daily.   aspirin 81 MG chewable tablet Chew 81 mg by mouth daily.   blood glucose meter kit and supplies Kit Dispense based on patient and insurance preference. Use up to four times daily as directed. (FOR ICD-9 250.00, 250.01).   Calcium Carb-Cholecalciferol 600-500 MG-UNIT Caps Take 1 capsule by mouth daily.   CERTAVITE SENIOR/ANTIOXIDANT Tabs Take 1 tablet by mouth daily.   donepezil 10 MG tablet Commonly known as:  ARICEPT Take 10 mg by mouth daily.   ferrous sulfate 325 (65 FE) MG tablet Take 1 tablet (325 mg total) daily with breakfast by mouth.   FLUoxetine 20 MG capsule Commonly known as:  PROZAC Take 20 mg by mouth daily.   glimepiride 4 MG tablet Commonly known as:  AMARYL Take 4 mg by mouth 2 (two) times daily.   insulin glargine 100 UNIT/ML injection Commonly known as:  LANTUS Inject 0.12 mLs (12 Units total) into the skin daily. Start taking on:  12/20/2017   levothyroxine 125 MCG tablet Commonly known as:  SYNTHROID, LEVOTHROID Take 125 mcg by mouth every morning.   LISTERINE ZERO Liqd Use as directed in the mouth or throat 2 (two) times daily.   loperamide 2 MG capsule Commonly known as:  IMODIUM Take 2 mg by mouth every 6 (six) hours as needed for diarrhea or loose stools.   memantine 10 MG tablet Commonly known as:  NAMENDA Take 10 mg by mouth 2 (two) times daily.   metFORMIN 500 MG 24 hr tablet Commonly known as:  GLUCOPHAGE-XR Take 1 tablet  (500 mg total) by mouth 3 (three) times daily. What changed:  when to take this   naproxen sodium 220 MG tablet Commonly known as:  ALEVE Take 220 mg by mouth 2 (two) times daily with a meal.   nitrofurantoin (macrocrystal-monohydrate) 100 MG capsule Commonly known as:  MACROBID Take 1 capsule (100 mg total) by mouth every 12 (twelve) hours.   TYLENOL PM EXTRA STRENGTH PO Take 1 tablet by mouth every evening.   Vitamin C 500 MG Chew Take 500 mg by mouth daily.       Discharge Assessment: Vitals:   12/18/17 2011 12/19/17 0406  BP: 130/63 (!) 145/65  Pulse: 88 73  Resp: 20 20  Temp: 99.7 F (37.6 C) 97.9 F (36.6 C)  SpO2: 100% 100%   Skin clean, dry and intact without evidence of skin break down, no evidence of skin tears noted. IV catheter discontinued intact. Site without signs and symptoms of complications - no redness or edema noted at insertion site, patient denies c/o pain - only slight tenderness at site.  Dressing with slight pressure applied.  D/c Instructions-Education: Discharge instructions given to patient/family with verbalized understanding. D/c education completed with patient/family including follow up instructions, medication list, d/c activities limitations if indicated, with other d/c instructions as indicated by MD - patient able to verbalize understanding, all questions fully answered. Patient  instructed to return to ED, call 911, or call MD for any changes in condition.  Patient escorted via [place], and D/C home via private auto. Son has discharge packet including scritps. Aware to give scripts to facility intake person.  Verbalizes understanding on d/c paperwork. Denies questions.   Eda Keys, RN 12/19/2017 11:39 AM

## 2017-12-19 NOTE — Progress Notes (Signed)
[facility] Was told that Bonita QuinLinda would call back. Provided call back number and requested call back due to transportation concerns. Stated "she will call you back".

## 2017-12-19 NOTE — Discharge Summary (Addendum)
[facility] NAME: Glenda Vaughn    MR#:  [id number]  DATE OF BIRTH:  [birth year]  DATE OF ADMISSION:  12/17/2017 ADMITTING PHYSICIAN: Gladstone Lighter, MD  DATE OF DISCHARGE: No discharge date for patient encounter.  PRIMARY CARE PHYSICIAN: Albina Billet, MD    ADMISSION DIAGNOSIS:  Hyperglycemia [R73.9] Acute cystitis with hematuria [N30.01]  DISCHARGE DIAGNOSIS:  Active Problems:   Sepsis ([place])   SECONDARY DIAGNOSIS:   Past Medical History:  Diagnosis Date  . Alzheimer's dementia   . Depression   . Diabetes mellitus without complication ([place])   . Hyperlipidemia   . Hypertension   . Thyroid disease     HOSPITAL COURSE:   Glenda Vaughn a77 y.o.femalewith a known history of dementia, depression, hypertension, NIDDM brought from assisted living facility for elevated blood sugars.  *Acute multidrug-resistant E. coli  Resolving  Cultures noted, treated initially with Rocephin, changed to Macrobid to complete antibiotic course going for   *Uncontrolled type 2 DM with hyperglycemia likely due to acute infection Controlled on current regiment  *CKD stage3 Stable  *Dementia, depression Stable Continue home psychotropic regiment  *Chronic hypothyroidism, unspecified Stable on Synthroid  DISCHARGE CONDITIONS:   stable  CONSULTS OBTAINED:    DRUG ALLERGIES:  No Known Allergies  DISCHARGE MEDICATIONS:   Allergies as of 12/19/2017   No Known Allergies     Medication List    STOP taking these medications   cephALEXin 500 MG capsule Commonly known as:  KEFLEX     TAKE these medications   ALIGN 4 MG Caps Take 4 mg by mouth daily.   aspirin 81 MG chewable tablet Chew 81 mg by mouth daily.   blood glucose meter kit and supplies Kit Dispense based on patient and insurance preference. Use up to four times daily as directed. (FOR ICD-9 250.00, 250.01).   Calcium Carb-Cholecalciferol  600-500 MG-UNIT Caps Take 1 capsule by mouth daily.   CERTAVITE SENIOR/ANTIOXIDANT Tabs Take 1 tablet by mouth daily.   donepezil 10 MG tablet Commonly known as:  ARICEPT Take 10 mg by mouth daily.   ferrous sulfate 325 (65 FE) MG tablet Take 1 tablet (325 mg total) daily with breakfast by mouth.   FLUoxetine 20 MG capsule Commonly known as:  PROZAC Take 20 mg by mouth daily.   glimepiride 4 MG tablet Commonly known as:  AMARYL Take 4 mg by mouth 2 (two) times daily.   insulin glargine 100 UNIT/ML injection Commonly known as:  LANTUS Inject 0.12 mLs (12 Units total) into the skin daily. Start taking on:  12/20/2017   levothyroxine 125 MCG tablet Commonly known as:  SYNTHROID, LEVOTHROID Take 125 mcg by mouth every morning.   LISTERINE ZERO Liqd Use as directed in the mouth or throat 2 (two) times daily.   loperamide 2 MG capsule Commonly known as:  IMODIUM Take 2 mg by mouth every 6 (six) hours as needed for diarrhea or loose stools.   memantine 10 MG tablet Commonly known as:  NAMENDA Take 10 mg by mouth 2 (two) times daily.   metFORMIN 500 MG 24 hr tablet Commonly known as:  GLUCOPHAGE-XR Take 1 tablet (500 mg total) by mouth 3 (three) times daily. What changed:  when to take this   naproxen sodium 220 MG tablet Commonly known as:  ALEVE Take 220 mg by mouth 2 (two) times daily with a meal.   nitrofurantoin (macrocrystal-monohydrate) 100 MG capsule Commonly known as:  MACROBID  Take 1 capsule (100 mg total) by mouth every 12 (twelve) hours.   TYLENOL PM EXTRA STRENGTH PO Take 1 tablet by mouth every evening.   Vitamin C 500 MG Chew Take 500 mg by mouth daily.        DISCHARGE INSTRUCTIONS:  If you experience worsening of your admission symptoms, develop shortness of breath, life threatening emergency, suicidal or homicidal thoughts you must seek medical attention immediately by calling 911 or calling your MD immediately  if symptoms less severe.  You  Must read complete instructions/literature along with all the possible adverse reactions/side effects for all the Medicines you take and that have been prescribed to you. Take any new Medicines after you have completely understood and accept all the possible adverse reactions/side effects.   Please note  You were cared for by a hospitalist during your hospital stay. If you have any questions about your discharge medications or the care you received while you were in the hospital after you are discharged, you can call the unit and asked to speak with the hospitalist on call if the hospitalist that took care of you is not available. Once you are discharged, your primary care physician will handle any further medical issues. Please note that NO REFILLS for any discharge medications will be authorized once you are discharged, as it is imperative that you return to your primary care physician (or establish a relationship with a primary care physician if you do not have one) for your aftercare needs so that they can reassess your need for medications and monitor your lab values.    Today   CHIEF COMPLAINT:   Chief Complaint  Patient presents with  . Hyperglycemia    HISTORY OF PRESENT ILLNESS:  77 y.o. female with a known history of dementia, depression, hypertension, NIDDM brought from assisted living facility for elevated blood sugars. Patient is not a great historian due to her dementia.  Most of the history obtained from son at bedside.  Patient is from assisted living facility but goes to [facility] 3 times a week for therapy.  She was noted to be lethargic and more confused than her baseline this morning.  When they checked her blood sugars, I was greater than 500. Patient is pleasantly confused, denies any complaints. Per family, her sugars are elevated when patient eats candy or when has an UTI. She spiked a fever of 101F in the ED. Labs showing significant UTI. No nausea, vomiting or abdominal  pain. Patient denies any dysuria and frequency of urination.    VITAL SIGNS:  Blood pressure (!) 145/65, pulse 73, temperature 97.9 F (36.6 C), temperature source Oral, resp. rate 20, height 5' 6"  (1.676 m), weight 61.1 kg (134 lb 11.2 oz), SpO2 100 %.  I/O:    Intake/Output Summary (Last 24 hours) at 12/19/2017 1105 Last data filed at 12/19/2017 1031 Gross per 24 hour  Intake 864 ml  Output 350 ml  Net 514 ml    PHYSICAL EXAMINATION:  GENERAL:  77 y.o.-year-old patient lying in the bed with no acute distress.  EYES: Pupils equal, round, reactive to light and accommodation. No scleral icterus. Extraocular muscles intact.  HEENT: Head atraumatic, normocephalic. Oropharynx and nasopharynx clear.  NECK:  Supple, no jugular venous distention. No thyroid enlargement, no tenderness.  LUNGS: Normal breath sounds bilaterally, no wheezing, rales,rhonchi or crepitation. No use of accessory muscles of respiration.  CARDIOVASCULAR: S1, S2 normal. No murmurs, rubs, or gallops.  ABDOMEN: Soft, non-tender, non-distended. Bowel sounds  present. No organomegaly or mass.  EXTREMITIES: No pedal edema, cyanosis, or clubbing.  NEUROLOGIC: Cranial nerves II through XII are intact. Muscle strength 5/5 in all extremities. Sensation intact. Gait not checked.  PSYCHIATRIC: The patient is alert and oriented x 3.  SKIN: No obvious rash, lesion, or ulcer.   DATA REVIEW:   CBC Recent Labs  Lab 12/18/17 0443  WBC 10.8  HGB 9.1*  HCT 26.7*  PLT 360    Chemistries  Recent Labs  Lab 12/18/17 0443  NA 139  K 3.8  CL 107  CO2 23  GLUCOSE 218*  BUN 24*  CREATININE 1.01*  CALCIUM 8.3*    Cardiac Enzymes No results for input(s): TROPONINI in the last 168 hours.  Microbiology Results  Results for orders placed or performed during the hospital encounter of 12/17/17  Urine Culture     Status: Abnormal (Preliminary result)   Collection Time: 12/17/17  3:38 PM  Result Value Ref Range Status    Specimen Description   Final    URINE, RANDOM Performed at [facility], [address] [place], [place] [address]    Special Requests   Final    NONE Performed at [facility], [address], [place], [place] [address]    Culture >=100,000 COLONIES/mL GRAM NEGATIVE RODS (A)  Final   Report Status PENDING  Incomplete  Blood Culture (routine x 2)     Status: None (Preliminary result)   Collection Time: 12/17/17  6:31 PM  Result Value Ref Range Status   Specimen Description BLOOD LEFT ANTECUBITAL  Final   Special Requests   Final    BOTTLES DRAWN AEROBIC AND ANAEROBIC Blood Culture adequate volume   Culture   Final    NO GROWTH 2 DAYS Performed at [facility], [address] [place], [place] [address]    Report Status PENDING  Incomplete  Blood Culture (routine x 2)     Status: None (Preliminary result)   Collection Time: 12/17/17  8:20 PM  Result Value Ref Range Status   Specimen Description BLOOD BLOOD RIGHT HAND  Final   Special Requests   Final    BOTTLES DRAWN AEROBIC AND ANAEROBIC Blood Culture adequate volume   Culture   Final    NO GROWTH 2 DAYS Performed at [facility], [address], [place], [place] [address]    Report Status PENDING  Incomplete  MRSA PCR Screening     Status: None   Collection Time: 12/18/17  7:37 AM  Result Value Ref Range Status   MRSA by PCR NEGATIVE NEGATIVE Final    Comment:        The GeneXpert MRSA Assay (FDA approved for NASAL specimens only), is one component of a comprehensive MRSA colonization surveillance program. It is not intended to diagnose MRSA infection nor to guide or monitor treatment for MRSA infections. Performed at [facility], [address] [place], [place] [address]     RADIOLOGY:  Dg Chest Port 1 View  Result Date: 12/17/2017 CLINICAL DATA:  Hyperglycemia and hypertension.  Fever. EXAM: PORTABLE CHEST 1 VIEW COMPARISON:  April 11, 2017 FINDINGS: There  is minimal bibasilar scarring. There is no edema or consolidation. Heart size and pulmonary vascularity are normal. No adenopathy. No evident bone lesions. IMPRESSION: Slight bibasilar scarring. No edema or consolidation. Heart size normal. Electronically Signed   By: Lowella Grip III M.D.   On: 12/17/2017 18:25    EKG:   Orders placed or  performed during the hospital encounter of 04/11/17  . EKG 12-Lead  . EKG 12-Lead  . EKG      Management plans discussed with the patient, family and they are in agreement.  CODE STATUS:     Code Status Orders  (From admission, onward)        Start     Ordered   12/17/17 2115  Do not attempt resuscitation (DNR)  Continuous    Question Answer Comment  In the event of cardiac or respiratory ARREST Do not call a "code blue"   In the event of cardiac or respiratory ARREST Do not perform Intubation, CPR, defibrillation or ACLS   In the event of cardiac or respiratory ARREST Use medication by any route, position, wound care, and other measures to relive pain and suffering. May use oxygen, suction and manual treatment of airway obstruction as needed for comfort.      12/17/17 2114    Code Status History    Date Active Date Inactive Code Status Order ID Comments User Context   04/26/2017 0057 04/28/2017 1626 DNR [id number]  Gorden Harms, MD ED   04/11/2017 2236 04/12/2017 1650 DNR [id number]  Vaughan Basta, MD Inpatient   04/04/2016 0106 04/05/2016 1602 DNR [id number]  Hugelmeyer, Ubaldo Glassing, DO Inpatient      TOTAL TIME TAKING CARE OF THIS PATIENT: 45 minutes.    Glenda Vaughn M.D on 12/19/2017 at 11:05 AM  Between 7am to 6pm - Pager - [phone]  After 6pm go to [email or web address] - password [email or web address] [place] [facility]  Office  [phone]  CC: Primary care physician; Albina Billet, MD   Note: This dictation was prepared with Dragon dictation along with smaller phrase technology. Any transcriptional errors that  result from this process are unintentional.

## 2017-12-19 NOTE — Progress Notes (Signed)
Left message for pt's son Virl DiamondChuck. Waiting on call back.

## 2017-12-19 NOTE — Care Management Note (Signed)
Case Management Note  Patient Details  Name: Tobias AlexanderCarolyn L Rossi MRN: [id number] Date of Birth: 11/03/1940   Patient to discharge today back to ALF.  CSW facilitating.  Home health PT ordered.  RNCM spoke with daughter in law who is agreeable to home health services.  States she does not have a preference of agency.  Facility preference for agency [facility] home [facility].  [facility].  RNCM signing off.   Subjective/Objective:                    Action/Plan:   Expected Discharge Date:  12/19/17               Expected Discharge Plan:  Assisted Living / Rest Home(with home health )  In-House Referral:     Discharge planning Services  CM Consult  Post Acute Care Choice:  Home Health Choice offered to:  Adult Children  DME Arranged:    DME Agency:     HH Arranged:  PT HH Agency:  [facility]  Status of Service:  Completed, signed off  If discussed at Long Length of Stay Meetings, dates discussed:    Additional Comments:  Chapman FitchBOWEN, Danner Paulding T, RN 12/19/2017, 10:57 AM

## 2017-12-19 NOTE — Progress Notes (Signed)
[facility]. Questions asked and answered. Aware that pt's son is to transport pt to facility.

## 2017-12-19 NOTE — NC FL2 (Signed)
[place] LEVEL OF CARE SCREENING TOOL     IDENTIFICATION  Patient Name: Glenda Vaughn Birthdate: 04-25-1941 Sex: female Admission Date (Current Location): 12/17/2017  [facility] and [place] Number:  Engineering geologist and Address:  [facility], [address], [place], [place] [address]      Provider Number: [id number]  Attending Physician Name and Address:  Gorden Harms, MD  Relative Name and Phone Number:       Current Level of Care: Hospital Recommended Level of Care: [place] Prior Approval Number:    Date Approved/Denied:   PASRR Number:    Discharge Plan: (ALF)    Current Diagnoses: Patient Active Problem List   Diagnosis Date Noted  . Encephalopathy acute 04/25/2017  . Sepsis ([place]) 04/11/2017  . Elevated troponin 04/04/2016  . Fall 04/04/2016  . Alzheimer's dementia 04/04/2016  . Generalized weakness 04/04/2016  . Acute cystitis without hematuria 04/04/2016  . Iron deficiency anemia 04/04/2016  . Hyponatremia 04/04/2016  . Mild aortic stenosis 04/04/2016  . Urinary tract infection with hematuria     Orientation RESPIRATION BLADDER Height & Weight     Self, Situation, Place  Normal Continent Weight: 134 lb 11.2 oz (61.1 kg) Height:  5' 6"  (167.6 cm)  BEHAVIORAL SYMPTOMS/MOOD NEUROLOGICAL BOWEL NUTRITION STATUS  (none) (none) Continent Diet(carb modified)  AMBULATORY STATUS COMMUNICATION OF NEEDS Skin   Limited Assist Verbally Normal                       Personal Care Assistance Level of Assistance  Bathing, Feeding, Dressing Bathing Assistance: Limited assistance Feeding assistance: Limited assistance Dressing Assistance: Limited assistance     Functional Limitations Info  (none reported)          SPECIAL CARE FACTORS FREQUENCY  PT (By licensed PT) by [facility]                    Contractures Contractures Info: Not present    Additional Factors Info  Code Status  Code Status Info: dnr                Medication List    STOP taking these medications   cephALEXin 500 MG capsule Commonly known as:  KEFLEX     TAKE these medications   ALIGN 4 MG Caps Take 4 mg by mouth daily.   aspirin 81 MG chewable tablet Chew 81 mg by mouth daily.   blood glucose meter kit and supplies Kit Dispense based on patient and insurance preference. Use up to four times daily as directed. (FOR ICD-9 250.00, 250.01).   Calcium Carb-Cholecalciferol 600-500 MG-UNIT Caps Take 1 capsule by mouth daily.   CERTAVITE SENIOR/ANTIOXIDANT Tabs Take 1 tablet by mouth daily.   donepezil 10 MG tablet Commonly known as:  ARICEPT Take 10 mg by mouth daily.   ferrous sulfate 325 (65 FE) MG tablet Take 1 tablet (325 mg total) daily with breakfast by mouth.   FLUoxetine 20 MG capsule Commonly known as:  PROZAC Take 20 mg by mouth daily.   glimepiride 4 MG tablet Commonly known as:  AMARYL Take 4 mg by mouth 2 (two) times daily.   insulin glargine 100 UNIT/ML injection Commonly known as:  LANTUS Inject 0.12 mLs (12 Units total) into the skin daily. Start taking on:  12/20/2017   levothyroxine 125 MCG tablet Commonly known as:  SYNTHROID, LEVOTHROID Take 125 mcg by mouth every morning.   LISTERINE  ZERO Liqd Use as directed in the mouth or throat 2 (two) times daily.   loperamide 2 MG capsule Commonly known as:  IMODIUM Take 2 mg by mouth every 6 (six) hours as needed for diarrhea or loose stools.   memantine 10 MG tablet Commonly known as:  NAMENDA Take 10 mg by mouth 2 (two) times daily.   metFORMIN 500 MG 24 hr tablet Commonly known as:  GLUCOPHAGE-XR Take 500 mg by mouth 2 (two) times daily.   naproxen sodium 220 MG tablet Commonly known as:  ALEVE Take 220 mg by mouth 2 (two) times daily with a meal.   nitrofurantoin (macrocrystal-monohydrate) 100 MG capsule Commonly known as:  MACROBID Take 1 capsule (100 mg total) by mouth  every 12 (twelve) hours.   TYLENOL PM EXTRA STRENGTH PO Take 1 tablet by mouth every evening.   Vitamin C 500 MG Chew Take 500 mg by mouth daily.      Shela Leff, [place]

## 2017-12-19 NOTE — Clinical Social Work Note (Signed)
Clinical Social Work Assessment  Patient Details  Name: Glenda Vaughn MRN: [id number] Date of Birth: Jul 27, 1940  Date of referral:  12/19/17               Reason for consult:  Discharge Planning                Permission sought to share information with:    Permission granted to share information::     Name::        Agency::     Relationship::     Contact Information:     Housing/Transportation Living arrangements for the past 2 months:  Assisted Living Facility Source of Information:  Medical Team, Facility Patient Interpreter Needed:  None Criminal Activity/Legal Involvement Pertinent to Current Situation/Hospitalization:  No - Comment as needed Significant Relationships:  Adult Children Lives with:  Facility Resident Do you feel safe going back to the place where you live?  Yes Need for family participation in patient care:  Yes (Comment)  Care giving concerns:  [facility]   Social Worker assessment / plan:  CSW informed by physician that patient is discharging today. Patient was on oral medication for her diabetes prior to admission to the hospital however, patient will now require injections and was on a sliding scale at the hospital. CS[facility] and they will not do sliding scale insulin but will do the lantus injections. CSW has informed the physician and patient will not be going on sliding scale insulin. [facility] aware and in agreement with patient's return.   Patient will be set up with home health physical therapy as physical therapy worked with patient late yesterday and recommended short term rehab, however, patient was able to ambulate 10 feet with min assist.   Patient's son is aware of discharge and is coming to [facility]. York SpanielMonica Maeola Mchaney MSW,LCSW [phone]  Employment status:  Retired Database administratornsurance information:  Managed Medicare PT  Recommendations:    Information / Referral to community resources:     Patient/Family's Response to care:  Patient's son in agreement with [facility].  Patient/Family's Understanding of and Emotional Response to Diagnosis, Current Treatment, and Prognosis:  Patient's nurse spoke with patient's son this morning regarding potential for discharge and patient's son was pleased that patient was going to be able to discharge.  Emotional Assessment Appearance:  Appears stated age Attitude/Demeanor/Rapport:    Affect (typically observed):    Orientation:    Alcohol / Substance use:  Not Applicable Psych involvement (Current and /or in the community):  No (Comment)  Discharge Needs  Concerns to be addressed:  Care Coordination Readmission within the last 30 days:  No Current discharge risk:  None Barriers to Discharge:  No Barriers Identified   York SpanielMonica Zanobia Griebel, LCSW 12/19/2017, 11:36 AM

## 2017-12-20 LAB — URINE CULTURE

## 2017-12-22 LAB — CULTURE, BLOOD (ROUTINE X 2)
CULTURE: NO GROWTH
Culture: NO GROWTH
SPECIAL REQUESTS: ADEQUATE
Special Requests: ADEQUATE

## 2018-02-12 ENCOUNTER — Emergency Department: Payer: Medicare Other

## 2018-02-12 ENCOUNTER — Other Ambulatory Visit: Payer: Self-pay

## 2018-02-12 ENCOUNTER — Emergency Department
Admission: EM | Admit: 2018-02-12 | Discharge: 2018-02-12 | Disposition: A | Discharge status: Home / Self Care | Payer: Medicare Other | Attending: Emergency Medicine | Admitting: Emergency Medicine

## 2018-02-12 DIAGNOSIS — Z7984 Long term (current) use of oral hypoglycemic drugs: Secondary | ICD-10-CM | POA: Diagnosis not present

## 2018-02-12 DIAGNOSIS — Z7982 Long term (current) use of aspirin: Secondary | ICD-10-CM | POA: Diagnosis not present

## 2018-02-12 DIAGNOSIS — W19XXXA Unspecified fall, initial encounter: Secondary | ICD-10-CM | POA: Insufficient documentation

## 2018-02-12 DIAGNOSIS — E119 Type 2 diabetes mellitus without complications: Secondary | ICD-10-CM | POA: Diagnosis not present

## 2018-02-12 DIAGNOSIS — N390 Urinary tract infection, site not specified: Secondary | ICD-10-CM | POA: Insufficient documentation

## 2018-02-12 DIAGNOSIS — Y92199 Unspecified place in other specified residential institution as the place of occurrence of the external cause: Secondary | ICD-10-CM | POA: Insufficient documentation

## 2018-02-12 DIAGNOSIS — Y939 Activity, unspecified: Secondary | ICD-10-CM | POA: Diagnosis not present

## 2018-02-12 DIAGNOSIS — Y999 Unspecified external cause status: Secondary | ICD-10-CM | POA: Diagnosis not present

## 2018-02-12 DIAGNOSIS — Z79899 Other long term (current) drug therapy: Secondary | ICD-10-CM | POA: Insufficient documentation

## 2018-02-12 DIAGNOSIS — I10 Essential (primary) hypertension: Secondary | ICD-10-CM | POA: Diagnosis not present

## 2018-02-12 DIAGNOSIS — R4182 Altered mental status, unspecified: Secondary | ICD-10-CM | POA: Diagnosis present

## 2018-02-12 DIAGNOSIS — G309 Alzheimer's disease, unspecified: Secondary | ICD-10-CM | POA: Insufficient documentation

## 2018-02-12 DIAGNOSIS — F0280 Dementia in other diseases classified elsewhere without behavioral disturbance: Secondary | ICD-10-CM | POA: Insufficient documentation

## 2018-02-12 LAB — CBC
HCT: 31.2 % — ABNORMAL LOW (ref 35.0–47.0)
HEMOGLOBIN: 10.4 g/dL — AB (ref 12.0–16.0)
MCH: 29.3 pg (ref 26.0–34.0)
MCHC: 33.4 g/dL (ref 32.0–36.0)
MCV: 87.6 fL (ref 80.0–100.0)
Platelets: 400 10*3/uL (ref 150–440)
RBC: 3.56 MIL/uL — AB (ref 3.80–5.20)
RDW: 17.1 % — ABNORMAL HIGH (ref 11.5–14.5)
WBC: 6.2 10*3/uL (ref 3.6–11.0)

## 2018-02-12 LAB — COMPREHENSIVE METABOLIC PANEL
ALK PHOS: 89 U/L (ref 38–126)
ALT: 19 U/L (ref 0–44)
ANION GAP: 9 (ref 5–15)
AST: 27 U/L (ref 15–41)
Albumin: 3.7 g/dL (ref 3.5–5.0)
BILIRUBIN TOTAL: 0.3 mg/dL (ref 0.3–1.2)
BUN: 26 mg/dL — ABNORMAL HIGH (ref 8–23)
CALCIUM: 8.6 mg/dL — AB (ref 8.9–10.3)
CO2: 24 mmol/L (ref 22–32)
CREATININE: 1.29 mg/dL — AB (ref 0.44–1.00)
Chloride: 106 mmol/L (ref 98–111)
GFR, EST AFRICAN AMERICAN: 45 mL/min — AB (ref >60)
GFR, EST NON AFRICAN AMERICAN: 39 mL/min — AB (ref >60)
Glucose, Bld: 275 mg/dL — ABNORMAL HIGH (ref 70–99)
Potassium: 3.8 mmol/L (ref 3.5–5.1)
Sodium: 139 mmol/L (ref 135–145)
TOTAL PROTEIN: 7.2 g/dL (ref 6.5–8.1)

## 2018-02-12 LAB — URINALYSIS, COMPLETE (UACMP) WITH MICROSCOPIC
BILIRUBIN URINE: NEGATIVE
Bacteria, UA: NONE SEEN
Ketones, ur: NEGATIVE mg/dL
NITRITE: NEGATIVE
PH: 6 (ref 5.0–8.0)
Protein, ur: 30 mg/dL — AB
Specific Gravity, Urine: 1.013 (ref 1.005–1.030)
WBC, UA: >50 WBC/hpf — ABNORMAL HIGH (ref 0–5)

## 2018-02-12 MED ORDER — CEPHALEXIN 500 MG PO CAPS: 500.0000 mg | ORAL_CAPSULE | Freq: Two times a day (BID) | ORAL | 0 refills | Status: DC

## 2018-02-12 MED ORDER — SODIUM CHLORIDE 0.9 % IV SOLN
1.0000 g | Freq: Once | INTRAVENOUS | Status: AC
  Administered 2018-02-12: 1 g via INTRAVENOUS
  Filled 2018-02-12: qty 10

## 2018-02-12 NOTE — ED Notes (Signed)
Pt taken to CT.

## 2018-02-12 NOTE — ED Notes (Signed)
Pt ambulatory to toilet with this RN. Posey fall alarm placed under pt at this time. Placed yellow fall socks and bracelet on pt. Placed yellow fall sign at doorway.

## 2018-02-12 NOTE — ED Notes (Signed)
Pt ambulatory to toilet with this RN.  

## 2018-02-12 NOTE — ED Provider Notes (Signed)
[facility] Emergency Department Provider Note  Time seen: 2:50 PM  I have reviewed the triage vital signs and the nursing notes.   HISTORY  Chief Complaint Altered Mental Status    HPI Glenda Vaughn is a 77 y.o. female with a past medical history of dementia, diabetes, hypertension, hyperlipidemia, presents from a nursing facility for "not acting right."  According to EMS report patient resides in a locked dementia unit had a fall last night, states this is atypical and was acting slower today.  They also report a very strong urine smell.  Patient is unable to provide any history due to severe baseline dementia.   Past Medical History:  Diagnosis Date  . Alzheimer's dementia   . Depression   . Diabetes mellitus without complication ([place])   . Hyperlipidemia   . Hypertension   . Thyroid disease     Patient Active Problem List   Diagnosis Date Noted  . Encephalopathy acute 04/25/2017  . Sepsis ([place]) 04/11/2017  . Elevated troponin 04/04/2016  . Fall 04/04/2016  . Alzheimer's dementia 04/04/2016  . Generalized weakness 04/04/2016  . Acute cystitis without hematuria 04/04/2016  . Iron deficiency anemia 04/04/2016  . Hyponatremia 04/04/2016  . Mild aortic stenosis 04/04/2016  . Urinary tract infection with hematuria     No past surgical history on file.  Prior to Admission medications   Medication Sig Start Date End Date Taking? Authorizing Provider  Ascorbic Acid (VITAMIN C) 500 MG CHEW Take 500 mg by mouth daily.    [provider]  aspirin 81 MG chewable tablet Chew 81 mg by mouth daily.    [provider]  blood glucose meter kit and supplies KIT Dispense based on patient and insurance preference. Use up to four times daily as directed. (FOR ICD-9 250.00, 250.01). 12/19/17   Salary, Avel Peace, MD  Calcium Carb-Cholecalciferol 600-500 MG-UNIT CAPS Take 1 capsule by mouth daily.    [provider]  Diphenhydramine-APAP,  sleep, (TYLENOL PM EXTRA STRENGTH PO) Take 1 tablet by mouth every evening.    [provider]  donepezil (ARICEPT) 10 MG tablet Take 10 mg by mouth daily.    [provider]  ferrous sulfate 325 (65 FE) MG tablet Take 1 tablet (325 mg total) daily with breakfast by mouth. 04/29/17   Leslye Peer, Richard, MD  FLUoxetine (PROZAC) 20 MG capsule Take 20 mg by mouth daily.    [provider]  glimepiride (AMARYL) 4 MG tablet Take 4 mg by mouth 2 (two) times daily.     [provider]  insulin glargine (LANTUS) 100 UNIT/ML injection Inject 0.12 mLs (12 Units total) into the skin daily. 12/20/17   Salary, Avel Peace, MD  levothyroxine (SYNTHROID, LEVOTHROID) 125 MCG tablet Take 125 mcg by mouth every morning.     [provider]  loperamide (IMODIUM) 2 MG capsule Take 2 mg by mouth every 6 (six) hours as needed for diarrhea or loose stools.     [provider]  memantine (NAMENDA) 10 MG tablet Take 10 mg by mouth 2 (two) times daily.    [provider]  metFORMIN (GLUCOPHAGE-XR) 500 MG 24 hr tablet Take 1 tablet (500 mg total) by mouth 3 (three) times daily. 12/19/17   Salary, Avel Peace, MD  Mouthwashes (LISTERINE ZERO) LIQD Use as directed in the mouth or throat 2 (two) times daily.    [provider]  Multiple Vitamins-Minerals (CERTAVITE SENIOR/ANTIOXIDANT) TABS Take 1 tablet by mouth daily.  [provider]  naproxen sodium (ANAPROX) 220 MG tablet Take 220 mg by mouth 2 (two) times daily with a meal.    [provider]  nitrofurantoin, macrocrystal-monohydrate, (MACROBID) 100 MG capsule Take 1 capsule (100 mg total) by mouth every 12 (twelve) hours. 12/19/17   Salary, Avel Peace, MD  Probiotic Product (ALIGN) 4 MG CAPS Take 4 mg by mouth daily.    [provider]    No Known Allergies  Family History  Problem Relation Age of Onset  . Prostate cancer Father     Social History Social History   Tobacco Use   . Smoking status: Never Smoker  . Smokeless tobacco: Never Used  Substance Use Topics  . Alcohol use: No  . Drug use: No    Review of Systems Unable to obtain an adequate/accurate review of systems secondary to baseline dementia ____________________________________________   PHYSICAL EXAM:  VITAL SIGNS: ED Triage Vitals [02/12/18 1428]  Enc Vitals Group     BP (!) 144/65     Pulse Rate 81     Resp 18     Temp 98.3 F (36.8 C)     Temp Source Oral     SpO2 99 %     Weight      Height      Head Circumference      Peak Flow      Pain Score      Pain Loc      Pain Edu?      Excl. in [place]?    Constitutional: Patient is awake, calm and pleasant.  She is disoriented unable to tell me where she is or what the year is. Eyes: Normal exam ENT   Head: Normocephalic and atraumatic.   Mouth/Throat: Mucous membranes are moist. Cardiovascular: Normal rate, regular rhythm. Respiratory: Normal respiratory effort without tachypnea nor retractions. Breath sounds are clear Gastrointestinal: Soft and nontender. No distention.   Musculoskeletal: Nontender with normal range of motion in all extremities.  Neurologic: Patient has normal speech but is confused which is likely her baseline.  Appears to be able to move all extremities. Skin:  Skin is warm, dry and intact.  Psychiatric: Mood and affect are normal/baseline for the patient.  ____________________________________________  RADIOLOGY  CT head negative  ____________________________________________   INITIAL IMPRESSION / ASSESSMENT AND PLAN / ED COURSE  Pertinent labs & imaging results that were available during my care of the patient were reviewed by me and considered in my medical decision making (see chart for details).  Patient presents to the emergency department after a fall last night and altered mental status today.  Patient has significant baseline dementia and is unable to to provide a history.  We will check  labs, CT scan of the head and closely monitor.  Patient's urinalysis does appear to show urinary tract infection greater than 50 white blood cells with white blood cell clumps.  We will start on IV Rocephin.  CT scan of the head is negative.  Urinalysis consistent with urinary tract infection.  I discussed this with the daughter.  We will ambulate the patient.  Nurse states the patient is ambulated well.  We will discharge with antibiotics.  Urine culture has been sent.  ____________________________________________   FINAL CLINICAL IMPRESSION(S) / ED DIAGNOSES  Urinary Tract Infection Cheral Marker, MD 02/12/18 [phone]

## 2018-02-12 NOTE — ED Notes (Signed)
Family at bedside. 

## 2018-02-12 NOTE — ED Triage Notes (Signed)
Pt arrives [facility] for AMS. "not acting right." [facility]   From a locked unit d/t alzheimers.   Had a fall last night. Sent over for possible UTI. Hx UTI, strong urine smell per EMS.   VSS. 20 R Fa. cbg 430, received insulin, CBG with EMS 232.   Ambulatory to toilet upon arrival with assistance.

## 2018-02-14 LAB — URINE CULTURE: Culture: >=100000 — AB

## 2018-02-15 NOTE — Progress Notes (Signed)
ED culture report showing >100 K E. Coli. Patient was discharged on Keflex but final susceptibilities show cefazolin resistance. Spoke with Dr. Don PerkingVeronese in ED - okay to change antibiotic to fosfomycin 3 gm po x 1.   Daughter made aware of changing antibiotics ([facility] [phone].  [facility] asked me to call their pharmacy, [facility], to give verbal. [phone].  Spoke with Cloria Springarissa, Pharmacologistpharmacy technician, at [facility] in [place]. Gave verbal for fosfomycin 3 gm po x 1, no refills. Order repeated back for accuracy. They will [facility] the [facility].  Novaleigh Kohlman A. Myers [place], [place]., BCPS Clinical Pharmacist 02/15/18 14:23

## 2018-02-19 ENCOUNTER — Other Ambulatory Visit: Payer: Self-pay

## 2018-02-19 ENCOUNTER — Emergency Department
Admission: EM | Admit: 2018-02-19 | Discharge: 2018-02-19 | Disposition: A | Discharge status: Home / Self Care | Payer: Medicare Other | Attending: Emergency Medicine | Admitting: Emergency Medicine

## 2018-02-19 DIAGNOSIS — Z7982 Long term (current) use of aspirin: Secondary | ICD-10-CM | POA: Insufficient documentation

## 2018-02-19 DIAGNOSIS — Z794 Long term (current) use of insulin: Secondary | ICD-10-CM | POA: Diagnosis not present

## 2018-02-19 DIAGNOSIS — Z79899 Other long term (current) drug therapy: Secondary | ICD-10-CM | POA: Diagnosis not present

## 2018-02-19 DIAGNOSIS — N3001 Acute cystitis with hematuria: Secondary | ICD-10-CM | POA: Diagnosis not present

## 2018-02-19 DIAGNOSIS — I10 Essential (primary) hypertension: Secondary | ICD-10-CM | POA: Diagnosis not present

## 2018-02-19 DIAGNOSIS — E11649 Type 2 diabetes mellitus with hypoglycemia without coma: Secondary | ICD-10-CM | POA: Diagnosis not present

## 2018-02-19 DIAGNOSIS — G309 Alzheimer's disease, unspecified: Secondary | ICD-10-CM | POA: Diagnosis not present

## 2018-02-19 DIAGNOSIS — E162 Hypoglycemia, unspecified: Secondary | ICD-10-CM

## 2018-02-19 LAB — CBC
HCT: 36 % (ref 35.0–47.0)
Hemoglobin: 12.1 g/dL (ref 12.0–16.0)
MCH: 29.2 pg (ref 26.0–34.0)
MCHC: 33.6 g/dL (ref 32.0–36.0)
MCV: 86.7 fL (ref 80.0–100.0)
PLATELETS: 444 10*3/uL — AB (ref 150–440)
RBC: 4.15 MIL/uL (ref 3.80–5.20)
RDW: 16.7 % — AB (ref 11.5–14.5)
WBC: 10 10*3/uL (ref 3.6–11.0)

## 2018-02-19 LAB — URINALYSIS, COMPLETE (UACMP) WITH MICROSCOPIC
BILIRUBIN URINE: NEGATIVE
Bacteria, UA: NONE SEEN
Glucose, UA: NEGATIVE mg/dL
KETONES UR: NEGATIVE mg/dL
Nitrite: NEGATIVE
PH: 5 (ref 5.0–8.0)
PROTEIN: 30 mg/dL — AB
RBC / HPF: >50 RBC/hpf — ABNORMAL HIGH (ref 0–5)
SQUAMOUS EPITHELIAL / LPF: NONE SEEN (ref 0–5)
Specific Gravity, Urine: 1.019 (ref 1.005–1.030)
WBC, UA: >50 WBC/hpf — ABNORMAL HIGH (ref 0–5)

## 2018-02-19 LAB — BASIC METABOLIC PANEL
ANION GAP: 12 (ref 5–15)
BUN: 31 mg/dL — ABNORMAL HIGH (ref 8–23)
CALCIUM: 9.7 mg/dL (ref 8.9–10.3)
CO2: 25 mmol/L (ref 22–32)
Chloride: 103 mmol/L (ref 98–111)
Creatinine, Ser: 1.54 mg/dL — ABNORMAL HIGH (ref 0.44–1.00)
GFR, EST AFRICAN AMERICAN: 37 mL/min — AB (ref >60)
GFR, EST NON AFRICAN AMERICAN: 32 mL/min — AB (ref >60)
Glucose, Bld: 80 mg/dL (ref 70–99)
Potassium: 3.8 mmol/L (ref 3.5–5.1)
SODIUM: 140 mmol/L (ref 135–145)

## 2018-02-19 LAB — GLUCOSE, CAPILLARY
GLUCOSE-CAPILLARY: 72 mg/dL (ref 70–99)
GLUCOSE-CAPILLARY: 173 mg/dL — AB (ref 70–99)
GLUCOSE-CAPILLARY: 266 mg/dL — AB (ref 70–99)

## 2018-02-19 MED ORDER — SODIUM CHLORIDE 0.9 % IV BOLUS
1000.0000 mL | Freq: Once | INTRAVENOUS | Status: AC
  Administered 2018-02-19: 1000 mL via INTRAVENOUS

## 2018-02-19 MED ORDER — FOSFOMYCIN TROMETHAMINE 3 G PO PACK
3.0000 g | PACK | Freq: Once | ORAL | Status: AC
  Administered 2018-02-19: 3 g via ORAL
  Filled 2018-02-19: qty 3

## 2018-02-19 MED ORDER — SODIUM CHLORIDE 0.9 % IV SOLN
1.0000 g | Freq: Once | INTRAVENOUS | Status: AC
  Administered 2018-02-19: 1 g via INTRAVENOUS
  Filled 2018-02-19: qty 10

## 2018-02-19 NOTE — ED Notes (Signed)
Assisted to drink 8oz orange juice and 1 saltine cracker. Pt states that she wants to rest and does not want anymore crackers despite encouraging patient to eat.

## 2018-02-19 NOTE — ED Notes (Signed)
Given 8oz of orange juice and crackers, encouraged to drink.

## 2018-02-19 NOTE — ED Notes (Signed)
Assisted up to bathroom. Family at bedside. Pt ambulates well.

## 2018-02-19 NOTE — ED Notes (Signed)
Blase Mess, med tech at [facility] given report.

## 2018-02-19 NOTE — ED Provider Notes (Signed)
[facility] Emergency Department Provider Note  ____________________________________________  Time seen: Approximately 7:51 AM  I have reviewed the triage vital signs and the nursing notes.   HISTORY  Chief Complaint Hypoglycemia  Level 5 caveat:  Portions of the history and physical were unable to be obtained due to dementia   HPI Glenda Vaughn is a 77 y.o. female with a history of Alzheimer's dementia, depression, hypertension, hypothyroidism, hyperlipidemia, diabetes who presents for evaluation of hypoglycemia.  Patient's blood glucose was 56 last night and she received her evening dose of Lantus.  This morning when her check her blood glucose was in the 40s which prompted EMS to be called.  According to the facility was just of regular morning check and patient was at baseline with no confusion, diaphoresis, nausea or vomiting.  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  Unclear why facility is getting low blood glucose on patient at this time.  She is currently at baseline per nursing home.  Patient has been started on Keflex 6 days ago for UTI.    Past Medical History:  Diagnosis Date  . Alzheimer's dementia   . Depression   . Diabetes mellitus without complication ([place])   . Hyperlipidemia   . Hypertension   . Thyroid disease     Patient Active Problem List   Diagnosis Date Noted  . Encephalopathy acute 04/25/2017  . Sepsis ([place]) 04/11/2017  . Elevated troponin 04/04/2016  . Fall 04/04/2016  . Alzheimer's dementia 04/04/2016  . Generalized weakness 04/04/2016  . Acute cystitis without hematuria 04/04/2016  . Iron deficiency anemia 04/04/2016  . Hyponatremia 04/04/2016  . Mild aortic stenosis 04/04/2016  . Urinary tract infection with hematuria     History reviewed. No pertinent surgical history.  Prior to Admission medications   Medication Sig Start Date End Date Taking?  Authorizing Provider  Ascorbic Acid (VITAMIN C) 500 MG CHEW Take 500 mg by mouth daily.   Yes [provider]  aspirin 81 MG chewable tablet Chew 81 mg by mouth daily.   Yes [provider]  Calcium Carb-Cholecalciferol 600-500 MG-UNIT CAPS Take 1 capsule by mouth daily.   Yes [provider]  donepezil (ARICEPT) 10 MG tablet Take 10 mg by mouth daily.   Yes [provider]  ferrous sulfate 325 (65 FE) MG tablet Take 1 tablet (325 mg total) daily with breakfast by mouth. 04/29/17  Yes Wieting, Richard, MD  FLUoxetine (PROZAC) 20 MG capsule Take 20 mg by mouth daily.   Yes [provider]  glimepiride (AMARYL) 4 MG tablet Take 4 mg by mouth 2 (two) times daily.    Yes [provider]  insulin glargine (LANTUS) 100 UNIT/ML injection Inject 0.12 mLs (12 Units total) into the skin daily. 12/20/17  Yes Salary, Avel Peace, MD  levothyroxine (SYNTHROID, LEVOTHROID) 125 MCG tablet Take 125 mcg by mouth every morning.    Yes [provider]  Melatonin 5 MG TABS Take 1 tablet by mouth at bedtime.   Yes [provider]  memantine (NAMENDA) 10 MG tablet Take 10 mg by mouth 2 (two) times daily.   Yes [provider]  metFORMIN (GLUCOPHAGE-XR) 500 MG 24 hr tablet Take 1 tablet (500 mg total) by mouth 3 (three) times daily. 12/19/17  Yes Salary, Avel Peace, MD  Mouthwashes (LISTERINE ZERO) LIQD Use as directed in the mouth or throat 2 (two) times daily.  Yes [provider]  naproxen sodium (ANAPROX) 220 MG tablet Take 220 mg by mouth 2 (two) times daily with a meal.   Yes [provider]  blood glucose meter kit and supplies KIT Dispense based on patient and insurance preference. Use up to four times daily as directed. (FOR ICD-9 250.00, 250.01). 12/19/17   Salary, Avel Peace, MD  nitrofurantoin, macrocrystal-monohydrate, (MACROBID) 100 MG capsule Take 1 capsule (100 mg total) by mouth every 12 (twelve) hours. Patient not  taking: Reported on 02/19/2018 12/19/17   Salary, Avel Peace, MD    Allergies Patient has no known allergies.  Family History  Problem Relation Age of Onset  . Prostate cancer Father     Social History Social History   Tobacco Use  . Smoking status: Never Smoker  . Smokeless tobacco: Never Used  Substance Use Topics  . Alcohol use: No  . Drug use: No    Review of Systems Constitutional: Negative for fever. Cardiovascular: Negative for chest pain. Respiratory: Negative for shortness of breath. Gastrointestinal: Negative for abdominal pain, vomiting or diarrhea.  ____________________________________________   PHYSICAL EXAM:  VITAL SIGNS: ED Triage Vitals  Enc Vitals Group     BP --      Pulse Rate 02/19/18 0744 71     Resp 02/19/18 0744 18     Temp 02/19/18 0744 98.1 F (36.7 C)     Temp Source 02/19/18 0744 Oral     SpO2 02/19/18 0744 98 %     Weight 02/19/18 0745 139 lb 11.2 oz (63.4 kg)     Height --      Head Circumference --      Peak Flow --      Pain Score --      Pain Loc --      Pain Edu? --      Excl. in [place]? --     Constitutional: Alert and oriented x 1. no apparent distress. HEENT:      Head: Normocephalic and atraumatic.         Eyes: Conjunctivae are normal. Sclera is non-icteric.       Mouth/Throat: Mucous membranes are moist.       Neck: Supple with no signs of meningismus. Cardiovascular: Regular rate and rhythm. No murmurs, gallops, or rubs. 2+ symmetrical distal pulses are present in all extremities. No JVD. Respiratory: Normal respiratory effort. Lungs are clear to auscultation bilaterally. No wheezes, crackles, or rhonchi.  Gastrointestinal: Soft, non tender, and non distended with positive bowel sounds. No rebound or guarding. Musculoskeletal: Nontender with normal range of motion in all extremities. No edema, cyanosis, or erythema of extremities. Neurologic: Normal speech and language. Face is symmetric. Moving all extremities. No gross  focal neurologic deficits are appreciated. Skin: Skin is warm, dry and intact. No rash noted. Psychiatric: Mood and affect are normal. Speech and behavior are normal.  ____________________________________________   LABS (all labs ordered are listed, but only abnormal results are displayed)  Labs Reviewed  CBC - Abnormal; Notable for the following components:      Result Value   RDW 16.7 (*)    Platelets 444 (*)    All other components within normal limits  BASIC METABOLIC PANEL - Abnormal; Notable for the following components:   BUN 31 (*)    Creatinine, Ser 1.54 (*)    GFR calc non Af Amer 32 (*)    GFR calc Af Amer 37 (*)    All other components within normal limits  URINALYSIS, COMPLETE (UACMP) WITH MICROSCOPIC - Abnormal; Notable for the following components:   Color, Urine YELLOW (*)    APPearance TURBID (*)    Hgb urine dipstick MODERATE (*)    Protein, ur 30 (*)    Leukocytes, UA MODERATE (*)    RBC / HPF >50 (*)    WBC, UA >50 (*)    All other components within normal limits  GLUCOSE, CAPILLARY - Abnormal; Notable for the following components:   Glucose-Capillary 173 (*)    All other components within normal limits  GLUCOSE, CAPILLARY - Abnormal; Notable for the following components:   Glucose-Capillary 266 (*)    All other components within normal limits  URINE CULTURE  GLUCOSE, CAPILLARY   ____________________________________________  EKG  ED ECG REPORT I, Rudene Re, the attending physician, personally viewed and interpreted this ECG.  Normal sinus rhythm, rate of 66, normal intervals, normal axis, no ST elevations or depressions. No changes when compared to prior ____________________________________________  RADIOLOGY  none  ____________________________________________   PROCEDURES  Procedure(s) performed: None Procedures Critical Care performed:  None ____________________________________________   INITIAL IMPRESSION / ASSESSMENT AND  PLAN / ED COURSE   77 y.o. female with a history of Alzheimer's dementia, depression, hypertension, hypothyroidism, hyperlipidemia, diabetes who presents for evaluation of hypoglycemia. Patient received lantus last night but no insulin this am. She is currently at baseline.  Blood glucose for fire department, EMS and for [place] here is within normal limits. Will monitor and repeat CBG in 1 hour. Will give orange juice and PB crackers. Will check UA to ensure UTI is clearing.   Clinical Course as of Feb 19 1454  Wed Feb 19, 2018  1057 Patient remains with no evidence of hypoglycemia.  Her urinalysis continues to be positive for UTI and patient was given IV rocephin. Patient's daughter then informed me that she received a phone call several days ago from [place] telling her that the bacteria was resistant to Keflex and telling the nursing home to switch her to fosfomycin 3 g x 1 dose.  Review of epic shows that urine culture grew E. coli which was resistant to Keflex.  Our pharmacist called patient's nursing home to switch her prescription to fosfomycin however that change was never done and patient continues to be on keflex. She received a one time dose of fosfomycin here as well and I spoke with the SNF to dc the keflex. New culture is pending.  There are no signs of sepsis.  Patient remains at baseline.   [CV]    Clinical Course User Index [CV] Alfred Levins [place], MD     As part of my medical decision making, I reviewed the following data within the [place] History obtained from family, Nursing notes reviewed and incorporated, Labs reviewed , EKG interpreted , Old chart reviewed, Notes from prior ED visits and [place] Controlled Substance Database    Pertinent labs & imaging results that were available during my care of the patient were reviewed by me and considered in my medical decision making (see chart for details).    ____________________________________________   FINAL CLINICAL  IMPRESSION(S) / ED DIAGNOSES  Final diagnoses:  Hypoglycemia  Acute cystitis with hematuria      NEW MEDICATIONS STARTED DURING THIS VISIT:  ED Discharge Orders    None       Note:  This document was prepared using Dragon voice recognition software and may include unintentional dictation errors.    Alfred Levins, [place], MD 02/19/18  [facility]

## 2018-02-19 NOTE — ED Notes (Signed)
Pt requesting breakfast, given cereal and 8oz orange juice. Pt family at bedside to assist with feeding. Updated on plan of care.

## 2018-02-19 NOTE — ED Triage Notes (Signed)
To ER via [facility] from [facility] for reported low blood sugar this AM of 46. EMS and FD both got blood sugars WNL without patient being administered any medications or anything PO. Pt recently treated for UTI. Hx of dementia, at baseline of alert to self only. VS WNL with EMS.

## 2018-02-19 NOTE — ED Notes (Signed)
Patient had green/black small amount of stool in brief. Pt taking iron supplement. pericare performed, brief changed.

## 2018-02-19 NOTE — ED Notes (Signed)
Up to bathroom

## 2018-02-19 NOTE — Discharge Instructions (Addendum)
Please discontinue keflex as patient was treated with rocephin and fosfomycin in the ED. Follow up with her doctor in 2 days for re-evaluation.  Return to the emergency room for fever, confusion, abdominal pain, flank pain. Resume her diabetes medications as patient's blood glucose was normal for EMS and in the ER.

## 2018-02-19 NOTE — ED Notes (Signed)
Due to patient being high fall risk-fall risk band placed on patient, side rails up X 2 , bed in lowest position, call bell at bedside, posey alarm placed under patient. Pt acknowledges not to get out of bed alone.

## 2018-02-19 NOTE — ED Notes (Signed)
Report to [place], RN  Daughter states she can transport pt back to [facility].

## 2018-02-20 LAB — URINE CULTURE: Culture: NO GROWTH

## 2018-04-18 DEATH — deceased

## 2019-03-23 IMAGING — CT CT HEAD W/O CM
3 of 6 series · 16 of 47 positions shown, 19 images · non-contrast
Comparison: 04/25/2017

CLINICAL DATA: Fell last night uncertain if she struck her head,
history hypertension, Alzheimer's

EXAM:
CT HEAD WITHOUT CONTRAST
TECHNIQUE: Contiguous axial images were obtained from the base of the skull
through the vertex without intravenous contrast. Sagittal and
coronal MPR images reconstructed from axial data set.

[Series 4: head wo · axial · 0.41mm/px · z∈[-133,-3]mm · 11 of 31 slices shown, 14 images]
[im 3/31  brain]
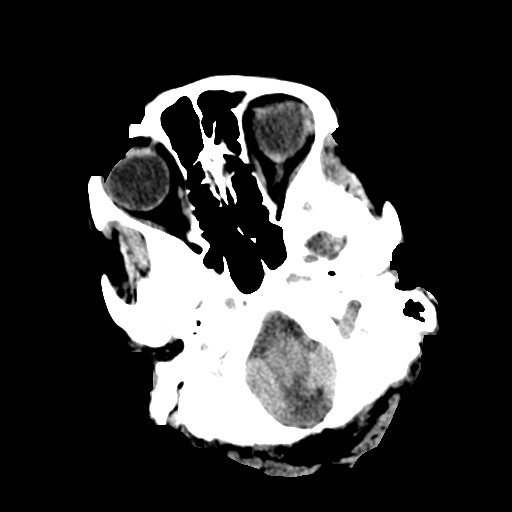
[im 3/31  bone]
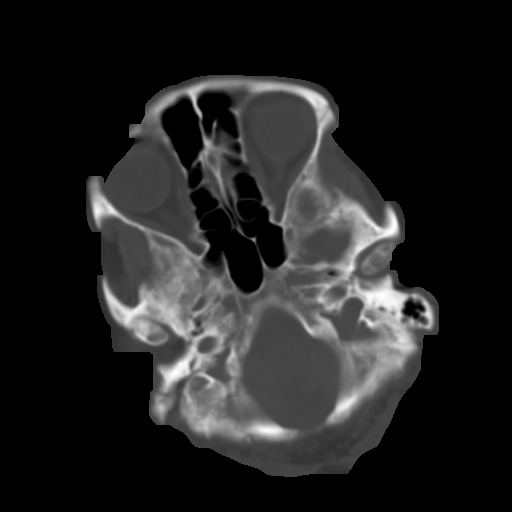
[im 5/31  brain]
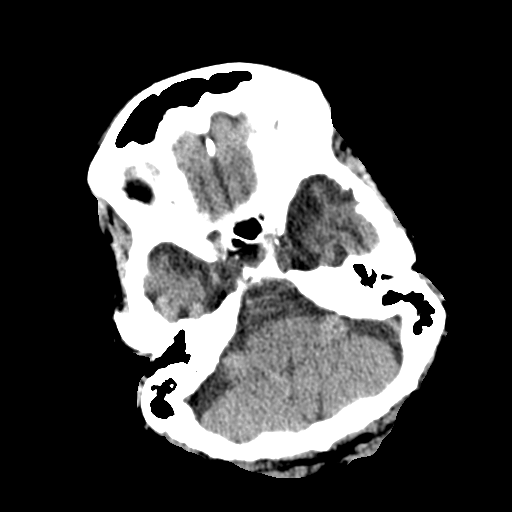
[im 9/31  brain]
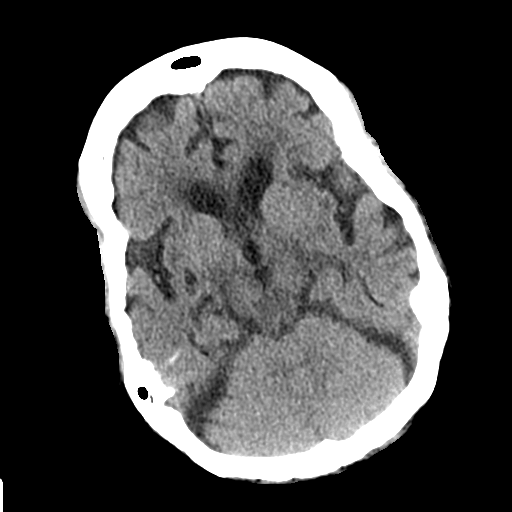
[im 11/31  brain]
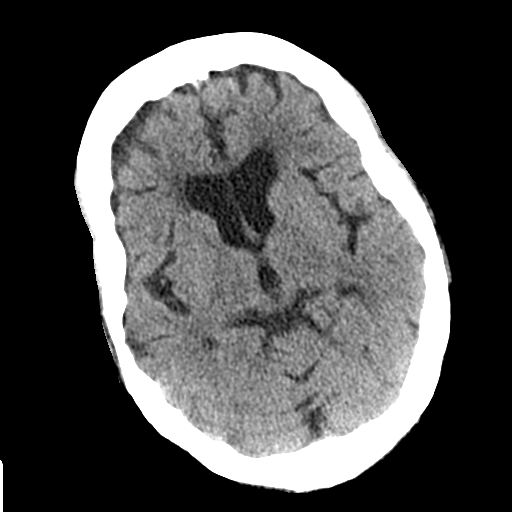
[im 13/31  brain]
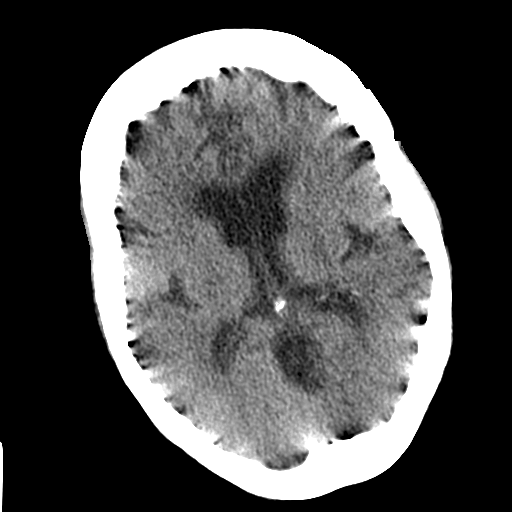
[im 13/31  bone]
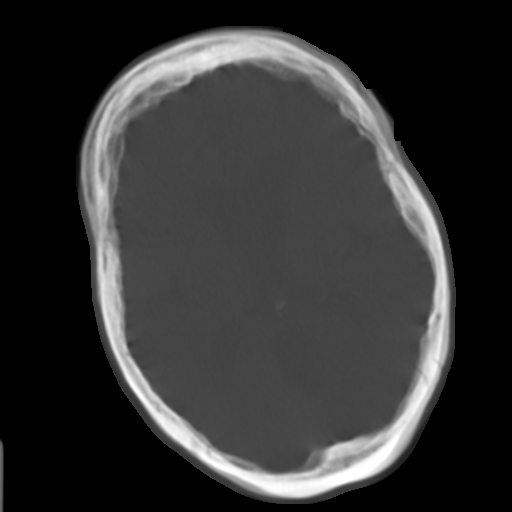
[im 17/31  brain]
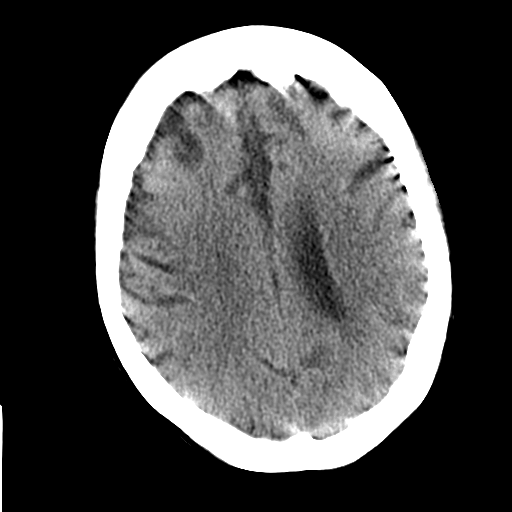
[im 19/31  brain]
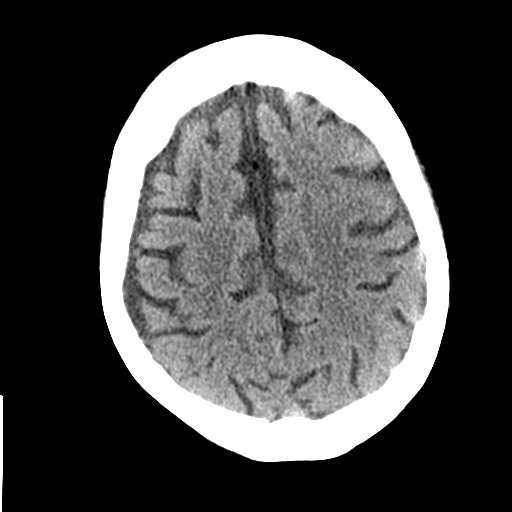
[im 21/31  brain]
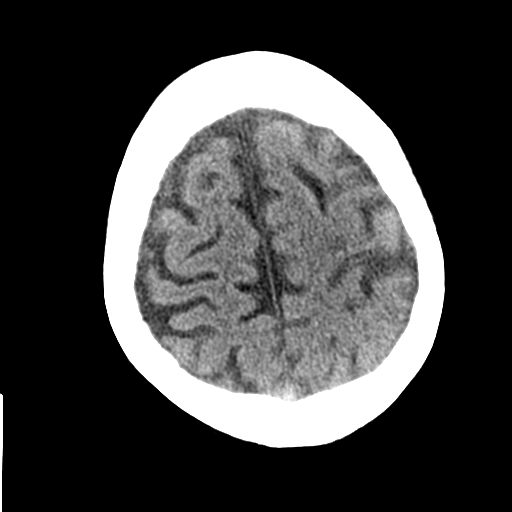
[im 23/31  brain]
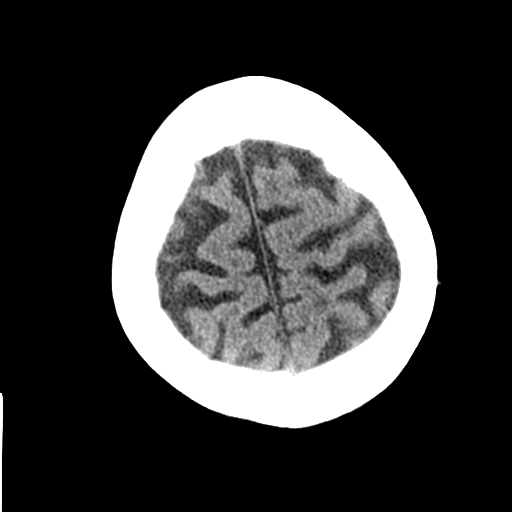
[im 23/31  bone]
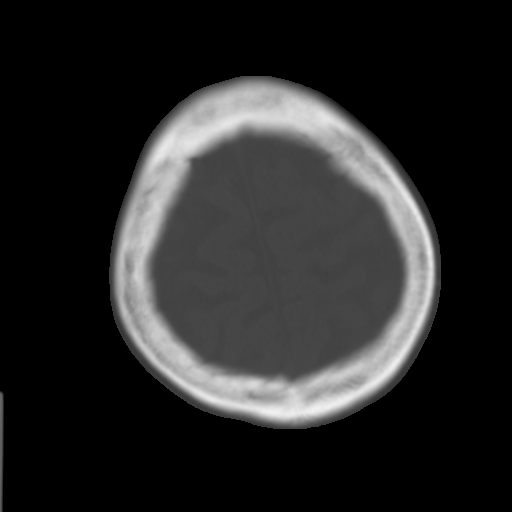
[im 27/31  brain]
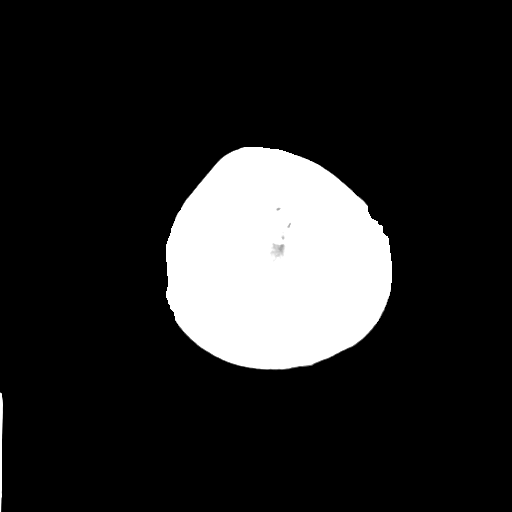
[im 29/31  brain]
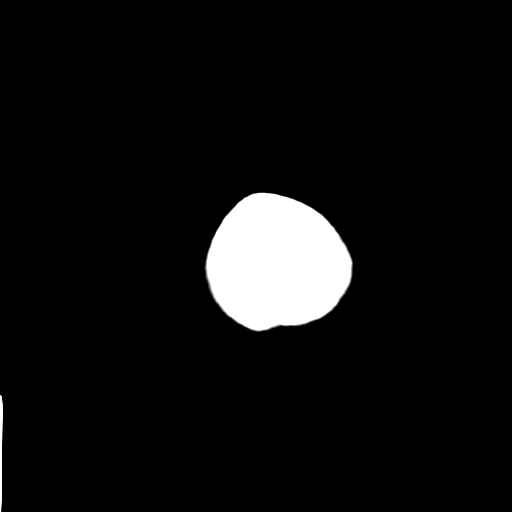

[Series 6: coronal soft tissue · coronal · 0.31mm/px · 3 of 68 slices shown]
[im 17/68  brain]
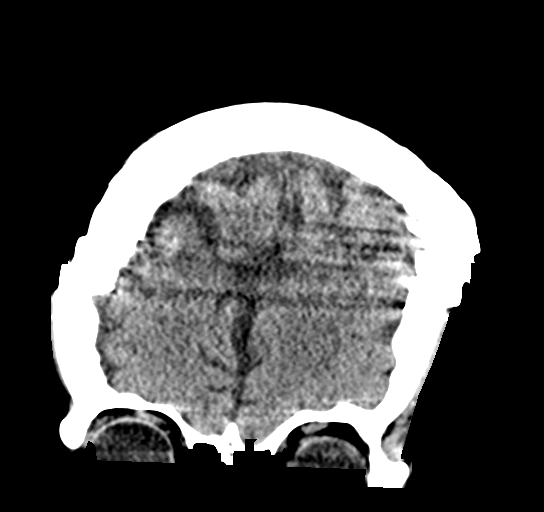
[im 34/68  brain]
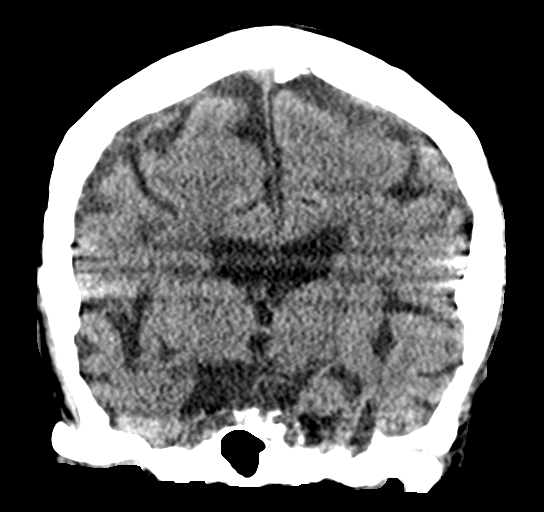
[im 51/68  brain]
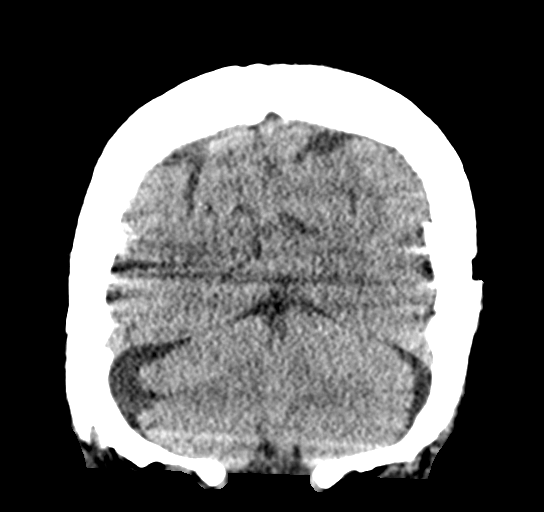

[Series 7: sagittal soft tissue · sagittal · 0.32mm/px · 2 of 53 slices shown]
[im 18/53  brain]
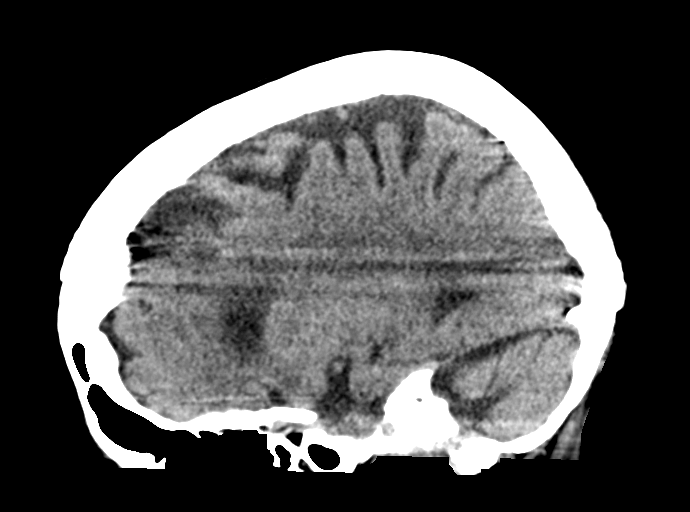
[im 35/53  brain]
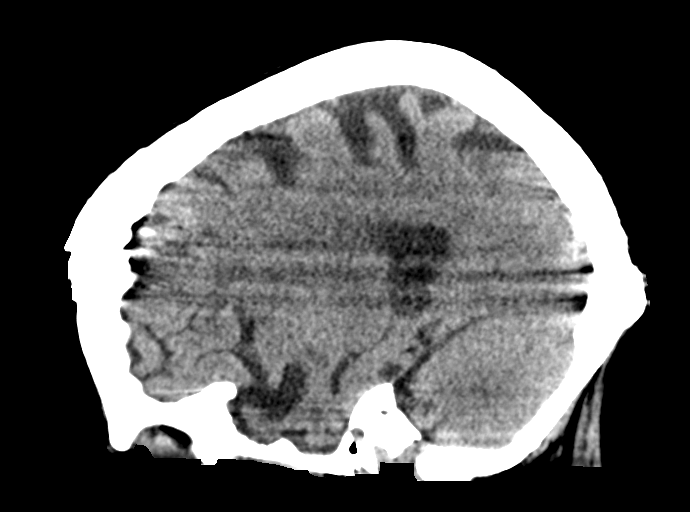

[16 of 47 positions shown; findings below may reference images not displayed]

FINDINGS: Brain: Scattered motion artifacts, for which repeat imaging was
performed. Generalized atrophy. Normal ventricular morphology. No
midline shift or mass effect. Small vessel chronic ischemic changes
of deep cerebral white matter. No intracranial hemorrhage, mass
lesion, or evidence of acute infarction. No extra-axial fluid
collections.

Vascular: Atherosclerotic calcifications of internal carotid
arteries at skull base

Skull: Intact

Sinuses/Orbits: Clear

Other: N/A
IMPRESSION: Atrophy with small vessel chronic ischemic changes of deep cerebral
white matter.

No acute intracranial abnormalities.
